# Patient Record
Sex: Male | Born: 1990 | Race: Black or African American | Hispanic: No | Marital: Single | Smoking: Current every day smoker
Health system: Southern US, Community
[De-identification: ages and names within clinical notes are randomized; demographics above are authoritative.]

## PROBLEM LIST (undated history)

## (undated) DIAGNOSIS — Z789 Other specified health status: Secondary | ICD-10-CM

## (undated) HISTORY — PX: APPENDECTOMY: SHX54

---

## 2017-06-04 ENCOUNTER — Emergency Department (HOSPITAL_COMMUNITY): Payer: No Typology Code available for payment source

## 2017-06-04 ENCOUNTER — Inpatient Hospital Stay (HOSPITAL_COMMUNITY)
Admission: EM | Admit: 2017-06-04 | Discharge: 2017-06-14 | DRG: 463 | Disposition: A | Discharge status: Home / Self Care | Payer: No Typology Code available for payment source | Attending: Orthopedic Surgery | Admitting: Orthopedic Surgery

## 2017-06-04 ENCOUNTER — Inpatient Hospital Stay (HOSPITAL_COMMUNITY): Payer: No Typology Code available for payment source

## 2017-06-04 DIAGNOSIS — S20219A Contusion of unspecified front wall of thorax, initial encounter: Secondary | ICD-10-CM | POA: Diagnosis present

## 2017-06-04 DIAGNOSIS — S2241XA Multiple fractures of ribs, right side, initial encounter for closed fracture: Secondary | ICD-10-CM | POA: Diagnosis present

## 2017-06-04 DIAGNOSIS — G8918 Other acute postprocedural pain: Secondary | ICD-10-CM

## 2017-06-04 DIAGNOSIS — R402143 Coma scale, eyes open, spontaneous, at hospital admission: Secondary | ICD-10-CM | POA: Diagnosis present

## 2017-06-04 DIAGNOSIS — Z419 Encounter for procedure for purposes other than remedying health state, unspecified: Secondary | ICD-10-CM

## 2017-06-04 DIAGNOSIS — T148XXA Other injury of unspecified body region, initial encounter: Secondary | ICD-10-CM

## 2017-06-04 DIAGNOSIS — S82202C Unspecified fracture of shaft of left tibia, initial encounter for open fracture type IIIA, IIIB, or IIIC: Secondary | ICD-10-CM | POA: Diagnosis present

## 2017-06-04 DIAGNOSIS — D62 Acute posthemorrhagic anemia: Secondary | ICD-10-CM

## 2017-06-04 DIAGNOSIS — S42301A Unspecified fracture of shaft of humerus, right arm, initial encounter for closed fracture: Principal | ICD-10-CM | POA: Diagnosis present

## 2017-06-04 DIAGNOSIS — S82252C Displaced comminuted fracture of shaft of left tibia, initial encounter for open fracture type IIIA, IIIB, or IIIC: Secondary | ICD-10-CM

## 2017-06-04 DIAGNOSIS — R52 Pain, unspecified: Secondary | ICD-10-CM | POA: Diagnosis present

## 2017-06-04 DIAGNOSIS — Z23 Encounter for immunization: Secondary | ICD-10-CM

## 2017-06-04 DIAGNOSIS — D72829 Elevated white blood cell count, unspecified: Secondary | ICD-10-CM

## 2017-06-04 DIAGNOSIS — S42101A Fracture of unspecified part of scapula, right shoulder, initial encounter for closed fracture: Secondary | ICD-10-CM | POA: Diagnosis present

## 2017-06-04 DIAGNOSIS — S30811A Abrasion of abdominal wall, initial encounter: Secondary | ICD-10-CM | POA: Diagnosis present

## 2017-06-04 DIAGNOSIS — R402253 Coma scale, best verbal response, oriented, at hospital admission: Secondary | ICD-10-CM | POA: Diagnosis present

## 2017-06-04 DIAGNOSIS — Z09 Encounter for follow-up examination after completed treatment for conditions other than malignant neoplasm: Secondary | ICD-10-CM

## 2017-06-04 DIAGNOSIS — S2249XA Multiple fractures of ribs, unspecified side, initial encounter for closed fracture: Secondary | ICD-10-CM

## 2017-06-04 DIAGNOSIS — R402363 Coma scale, best motor response, obeys commands, at hospital admission: Secondary | ICD-10-CM | POA: Diagnosis present

## 2017-06-04 DIAGNOSIS — S81812A Laceration without foreign body, left lower leg, initial encounter: Secondary | ICD-10-CM | POA: Diagnosis present

## 2017-06-04 DIAGNOSIS — Z880 Allergy status to penicillin: Secondary | ICD-10-CM | POA: Diagnosis not present

## 2017-06-04 DIAGNOSIS — T07XXXA Unspecified multiple injuries, initial encounter: Secondary | ICD-10-CM

## 2017-06-04 DIAGNOSIS — S42109A Fracture of unspecified part of scapula, unspecified shoulder, initial encounter for closed fracture: Secondary | ICD-10-CM

## 2017-06-04 DIAGNOSIS — Y92414 Local residential or business street as the place of occurrence of the external cause: Secondary | ICD-10-CM

## 2017-06-04 DIAGNOSIS — S27329A Contusion of lung, unspecified, initial encounter: Secondary | ICD-10-CM

## 2017-06-04 DIAGNOSIS — E871 Hypo-osmolality and hyponatremia: Secondary | ICD-10-CM

## 2017-06-04 HISTORY — DX: Other specified health status: Z78.9

## 2017-06-04 LAB — COMPREHENSIVE METABOLIC PANEL
ALBUMIN: 3.8 g/dL (ref 3.5–5.0)
ALT: 136 U/L — ABNORMAL HIGH (ref 17–63)
ANION GAP: 12 (ref 5–15)
AST: 186 U/L — ABNORMAL HIGH (ref 15–41)
Alkaline Phosphatase: 77 U/L (ref 38–126)
BILIRUBIN TOTAL: 0.6 mg/dL (ref 0.3–1.2)
BUN: 12 mg/dL (ref 6–20)
CO2: 21 mmol/L — ABNORMAL LOW (ref 22–32)
Calcium: 8.4 mg/dL — ABNORMAL LOW (ref 8.9–10.3)
Chloride: 104 mmol/L (ref 101–111)
Creatinine, Ser: 1.07 mg/dL (ref 0.61–1.24)
GFR calc Af Amer: >60 mL/min (ref >60)
Glucose, Bld: 136 mg/dL — ABNORMAL HIGH (ref 65–99)
POTASSIUM: 3.3 mmol/L — AB (ref 3.5–5.1)
Sodium: 137 mmol/L (ref 135–145)
TOTAL PROTEIN: 6.4 g/dL — AB (ref 6.5–8.1)

## 2017-06-04 LAB — I-STAT CHEM 8, ED
BUN: 15 mg/dL (ref 6–20)
CALCIUM ION: 1.04 mmol/L — AB (ref 1.15–1.40)
CREATININE: 1.1 mg/dL (ref 0.61–1.24)
Chloride: 105 mmol/L (ref 101–111)
Glucose, Bld: 135 mg/dL — ABNORMAL HIGH (ref 65–99)
HCT: 44 % (ref 39.0–52.0)
Hemoglobin: 15 g/dL (ref 13.0–17.0)
Potassium: 3.7 mmol/L (ref 3.5–5.1)
SODIUM: 142 mmol/L (ref 135–145)
TCO2: 23 mmol/L (ref 22–32)

## 2017-06-04 LAB — CBC
HEMATOCRIT: 42.8 % (ref 39.0–52.0)
HEMOGLOBIN: 14.9 g/dL (ref 13.0–17.0)
MCH: 31.9 pg (ref 26.0–34.0)
MCHC: 34.8 g/dL (ref 30.0–36.0)
MCV: 91.6 fL (ref 78.0–100.0)
Platelets: 211 10*3/uL (ref 150–400)
RBC: 4.67 MIL/uL (ref 4.22–5.81)
RDW: 13 % (ref 11.5–15.5)
WBC: 11.8 10*3/uL — AB (ref 4.0–10.5)

## 2017-06-04 LAB — PROTIME-INR
INR: 1.02
Prothrombin Time: 13.4 seconds (ref 11.4–15.2)

## 2017-06-04 LAB — SAMPLE TO BLOOD BANK

## 2017-06-04 LAB — I-STAT CG4 LACTIC ACID, ED: LACTIC ACID, VENOUS: 3.64 mmol/L — AB (ref 0.5–1.9)

## 2017-06-04 LAB — ETHANOL: Alcohol, Ethyl (B): <10 mg/dL (ref <10)

## 2017-06-04 LAB — CDS SEROLOGY

## 2017-06-04 MED ORDER — BACITRACIN-NEOMYCIN-POLYMYXIN OINTMENT TUBE
TOPICAL_OINTMENT | Freq: Two times a day (BID) | CUTANEOUS | Status: DC
  Administered 2017-06-05: 1 via TOPICAL
  Administered 2017-06-06 – 2017-06-07 (×2): via TOPICAL
  Administered 2017-06-07: 1 via TOPICAL
  Administered 2017-06-08 – 2017-06-09 (×4): via TOPICAL
  Administered 2017-06-10: 1 via TOPICAL
  Administered 2017-06-10 – 2017-06-11 (×2): via TOPICAL
  Administered 2017-06-12: 1 via TOPICAL
  Administered 2017-06-12 – 2017-06-14 (×5): via TOPICAL
  Filled 2017-06-04: qty 14.17
  Filled 2017-06-04 (×2): qty 1
  Filled 2017-06-04: qty 2

## 2017-06-04 MED ORDER — CEFAZOLIN SODIUM-DEXTROSE 2-4 GM/100ML-% IV SOLN
INTRAVENOUS | Status: AC
  Filled 2017-06-04: qty 100

## 2017-06-04 MED ORDER — TETANUS-DIPHTH-ACELL PERTUSSIS 5-2.5-18.5 LF-MCG/0.5 IM SUSP
INTRAMUSCULAR | Status: AC
  Filled 2017-06-04: qty 0.5

## 2017-06-04 MED ORDER — DEXTROSE-NACL 5-0.9 % IV SOLN
INTRAVENOUS | Status: DC
  Administered 2017-06-05 – 2017-06-09 (×5): via INTRAVENOUS

## 2017-06-04 MED ORDER — SODIUM CHLORIDE 0.9 % IV BOLUS (SEPSIS)
1000.0000 mL | Freq: Once | INTRAVENOUS | Status: AC
  Administered 2017-06-04: 1000 mL via INTRAVENOUS

## 2017-06-04 MED ORDER — IOPAMIDOL (ISOVUE-300) INJECTION 61%
INTRAVENOUS | Status: AC
  Administered 2017-06-04: 100 mL
  Filled 2017-06-04: qty 100

## 2017-06-04 MED ORDER — HYDROMORPHONE HCL 1 MG/ML IJ SOLN
1.0000 mg | INTRAMUSCULAR | Status: DC | PRN
  Administered 2017-06-04: 1 mg via INTRAVENOUS
  Filled 2017-06-04: qty 1

## 2017-06-04 MED ORDER — FENTANYL CITRATE (PF) 100 MCG/2ML IJ SOLN
100.0000 ug | Freq: Once | INTRAMUSCULAR | Status: AC
  Administered 2017-06-04: 100 ug via INTRAVENOUS

## 2017-06-04 MED ORDER — FENTANYL CITRATE (PF) 100 MCG/2ML IJ SOLN
INTRAMUSCULAR | Status: AC
  Filled 2017-06-04: qty 2

## 2017-06-04 MED ORDER — CEFAZOLIN SODIUM-DEXTROSE 2-4 GM/100ML-% IV SOLN
2.0000 g | Freq: Once | INTRAVENOUS | Status: AC
  Administered 2017-06-04: 2 g via INTRAVENOUS

## 2017-06-04 MED ORDER — TETANUS-DIPHTH-ACELL PERTUSSIS 5-2.5-18.5 LF-MCG/0.5 IM SUSP
0.5000 mL | Freq: Once | INTRAMUSCULAR | Status: AC
  Administered 2017-06-04: 0.5 mL via INTRAMUSCULAR

## 2017-06-04 MED ORDER — ONDANSETRON 4 MG PO TBDP
4.0000 mg | ORAL_TABLET | Freq: Four times a day (QID) | ORAL | Status: DC | PRN
  Filled 2017-06-04: qty 1

## 2017-06-04 MED ORDER — ONDANSETRON HCL 4 MG/2ML IJ SOLN
4.0000 mg | Freq: Four times a day (QID) | INTRAMUSCULAR | Status: DC | PRN
  Administered 2017-06-04 – 2017-06-06 (×2): 4 mg via INTRAVENOUS
  Filled 2017-06-04: qty 2

## 2017-06-04 NOTE — ED Notes (Signed)
Dr Jodi MourningZavitz given a copy of lactic acid results 3.64

## 2017-06-04 NOTE — ED Notes (Signed)
Port xray continues to be at bedside

## 2017-06-04 NOTE — ED Notes (Signed)
Samson FredericSwinteck, Brian MD at bedside
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
## 2017-06-04 NOTE — ED Notes (Signed)
Mother and fiance at bedside. Belongings given to mother including valuables ticket for items in safe.
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
## 2017-06-04 NOTE — Progress Notes (Signed)
Orthopedic Tech Progress Note Patient Details:  Luiz IronDarius Fugitt 03/30/1991 [id number]  Ortho Devices Type of Ortho Device: Ace wrap, Short leg splint, Stirrup splint Ortho Device/Splint Location: LLE Ortho Device/Splint Interventions: Ordered, Application   Post Interventions Patient Tolerated: Well Instructions Provided: Care of device   Jennye MoccasinHughes, Yue Glasheen Craig 06/04/2017, 9:12 PM

## 2017-06-04 NOTE — ED Notes (Signed)
Pt returned from CT, pt continues to state he does not want anyone called.

## 2017-06-05 ENCOUNTER — Inpatient Hospital Stay (HOSPITAL_COMMUNITY): Payer: No Typology Code available for payment source | Admitting: Anesthesiology

## 2017-06-05 ENCOUNTER — Encounter (HOSPITAL_COMMUNITY): Admission: EM | Disposition: A | Discharge status: Home / Self Care | Payer: Self-pay | Source: Home / Self Care

## 2017-06-05 ENCOUNTER — Inpatient Hospital Stay (HOSPITAL_COMMUNITY): Payer: No Typology Code available for payment source

## 2017-06-05 ENCOUNTER — Encounter (HOSPITAL_COMMUNITY): Payer: Self-pay | Admitting: Emergency Medicine

## 2017-06-05 HISTORY — PX: I & D EXTREMITY: SHX5045

## 2017-06-05 LAB — BASIC METABOLIC PANEL
ANION GAP: 7 (ref 5–15)
BUN: 13 mg/dL (ref 6–20)
CO2: 25 mmol/L (ref 22–32)
Calcium: 8.3 mg/dL — ABNORMAL LOW (ref 8.9–10.3)
Chloride: 107 mmol/L (ref 101–111)
Creatinine, Ser: 1.18 mg/dL (ref 0.61–1.24)
GFR calc Af Amer: >60 mL/min (ref >60)
Glucose, Bld: 142 mg/dL — ABNORMAL HIGH (ref 65–99)
POTASSIUM: 4.6 mmol/L (ref 3.5–5.1)
SODIUM: 139 mmol/L (ref 135–145)

## 2017-06-05 LAB — CBC
HCT: 37.3 % — ABNORMAL LOW (ref 39.0–52.0)
Hemoglobin: 12.5 g/dL — ABNORMAL LOW (ref 13.0–17.0)
MCH: 30.8 pg (ref 26.0–34.0)
MCHC: 33.5 g/dL (ref 30.0–36.0)
MCV: 91.9 fL (ref 78.0–100.0)
PLATELETS: 168 10*3/uL (ref 150–400)
RBC: 4.06 MIL/uL — AB (ref 4.22–5.81)
RDW: 13.3 % (ref 11.5–15.5)
WBC: 12.8 10*3/uL — AB (ref 4.0–10.5)

## 2017-06-05 LAB — HIV ANTIBODY (ROUTINE TESTING W REFLEX): HIV Screen 4th Generation wRfx: NONREACTIVE

## 2017-06-05 SURGERY — IRRIGATION AND DEBRIDEMENT EXTREMITY
Anesthesia: General | Laterality: Left

## 2017-06-05 MED ORDER — PROPOFOL 10 MG/ML IV BOLUS
INTRAVENOUS | Status: AC
  Filled 2017-06-05: qty 20

## 2017-06-05 MED ORDER — ARTIFICIAL TEARS OPHTHALMIC OINT
TOPICAL_OINTMENT | OPHTHALMIC | Status: AC
  Filled 2017-06-05: qty 3.5

## 2017-06-05 MED ORDER — METHOCARBAMOL 1000 MG/10ML IJ SOLN
500.0000 mg | Freq: Four times a day (QID) | INTRAVENOUS | Status: DC | PRN
  Filled 2017-06-05: qty 5

## 2017-06-05 MED ORDER — LACTATED RINGERS IV SOLN
INTRAVENOUS | Status: DC | PRN
  Administered 2017-06-05 (×2): via INTRAVENOUS

## 2017-06-05 MED ORDER — PHENYLEPHRINE HCL 10 MG/ML IJ SOLN
INTRAMUSCULAR | Status: DC | PRN
  Administered 2017-06-05: 50 ug/min via INTRAVENOUS

## 2017-06-05 MED ORDER — ROCURONIUM BROMIDE 10 MG/ML (PF) SYRINGE
PREFILLED_SYRINGE | INTRAVENOUS | Status: AC
  Filled 2017-06-05: qty 15

## 2017-06-05 MED ORDER — ONDANSETRON HCL 4 MG/2ML IJ SOLN
INTRAMUSCULAR | Status: DC | PRN
  Administered 2017-06-05: 4 mg via INTRAVENOUS

## 2017-06-05 MED ORDER — OXYCODONE HCL 5 MG PO TABS
5.0000 mg | ORAL_TABLET | ORAL | Status: DC | PRN
  Filled 2017-06-05: qty 1

## 2017-06-05 MED ORDER — FENTANYL CITRATE (PF) 250 MCG/5ML IJ SOLN
INTRAMUSCULAR | Status: AC
  Filled 2017-06-05: qty 5

## 2017-06-05 MED ORDER — LIDOCAINE HCL (PF) 2 % IJ SOLN
INTRAMUSCULAR | Status: DC | PRN
  Administered 2017-06-05: 60 mg via INTRADERMAL

## 2017-06-05 MED ORDER — METHOCARBAMOL 500 MG PO TABS
500.0000 mg | ORAL_TABLET | Freq: Four times a day (QID) | ORAL | Status: DC | PRN
  Administered 2017-06-05 – 2017-06-09 (×12): 500 mg via ORAL
  Filled 2017-06-05 (×11): qty 1

## 2017-06-05 MED ORDER — PHENYLEPHRINE 40 MCG/ML (10ML) SYRINGE FOR IV PUSH (FOR BLOOD PRESSURE SUPPORT)
PREFILLED_SYRINGE | INTRAVENOUS | Status: AC
  Filled 2017-06-05: qty 30

## 2017-06-05 MED ORDER — HYDROMORPHONE HCL 1 MG/ML IJ SOLN
0.2500 mg | INTRAMUSCULAR | Status: DC | PRN
  Administered 2017-06-05 (×2): 0.5 mg via INTRAVENOUS

## 2017-06-05 MED ORDER — DEXAMETHASONE SODIUM PHOSPHATE 10 MG/ML IJ SOLN
INTRAMUSCULAR | Status: AC
  Filled 2017-06-05: qty 3

## 2017-06-05 MED ORDER — HYDROMORPHONE HCL 1 MG/ML IJ SOLN
0.5000 mg | INTRAMUSCULAR | Status: DC | PRN
  Administered 2017-06-05 – 2017-06-06 (×8): 1 mg via INTRAVENOUS
  Administered 2017-06-06: 2 mg via INTRAVENOUS
  Administered 2017-06-06 – 2017-06-08 (×6): 1 mg via INTRAVENOUS
  Administered 2017-06-08: 2 mg via INTRAVENOUS
  Administered 2017-06-08 – 2017-06-09 (×6): 1 mg via INTRAVENOUS
  Filled 2017-06-05 (×2): qty 1
  Filled 2017-06-05: qty 2
  Filled 2017-06-05 (×14): qty 1
  Filled 2017-06-05: qty 2
  Filled 2017-06-05 (×3): qty 1

## 2017-06-05 MED ORDER — KETOROLAC TROMETHAMINE 15 MG/ML IJ SOLN
15.0000 mg | Freq: Four times a day (QID) | INTRAMUSCULAR | Status: AC
  Administered 2017-06-05 (×4): 15 mg via INTRAVENOUS
  Filled 2017-06-05 (×4): qty 1

## 2017-06-05 MED ORDER — HYDROMORPHONE HCL 1 MG/ML IJ SOLN
INTRAMUSCULAR | Status: AC
  Filled 2017-06-05: qty 2

## 2017-06-05 MED ORDER — METOCLOPRAMIDE HCL 5 MG/ML IJ SOLN: 5.0000 mg | Freq: Three times a day (TID) | INTRAMUSCULAR | Status: DC | PRN

## 2017-06-05 MED ORDER — FENTANYL CITRATE (PF) 250 MCG/5ML IJ SOLN
INTRAMUSCULAR | Status: DC | PRN
  Administered 2017-06-05: 150 ug via INTRAVENOUS
  Administered 2017-06-05: 50 ug via INTRAVENOUS

## 2017-06-05 MED ORDER — SUCCINYLCHOLINE CHLORIDE 20 MG/ML IJ SOLN
INTRAMUSCULAR | Status: DC | PRN
  Administered 2017-06-05: 160 mg via INTRAVENOUS

## 2017-06-05 MED ORDER — EPHEDRINE SULFATE 50 MG/ML IJ SOLN
INTRAMUSCULAR | Status: AC
  Filled 2017-06-05: qty 1

## 2017-06-05 MED ORDER — CLINDAMYCIN PHOSPHATE 600 MG/50ML IV SOLN
600.0000 mg | Freq: Four times a day (QID) | INTRAVENOUS | Status: DC
  Administered 2017-06-05 – 2017-06-06 (×5): 600 mg via INTRAVENOUS
  Filled 2017-06-05 (×7): qty 50

## 2017-06-05 MED ORDER — ENOXAPARIN SODIUM 40 MG/0.4ML ~~LOC~~ SOLN
40.0000 mg | SUBCUTANEOUS | Status: DC
  Administered 2017-06-07 – 2017-06-10 (×3): 40 mg via SUBCUTANEOUS
  Filled 2017-06-05 (×3): qty 0.4

## 2017-06-05 MED ORDER — PROPOFOL 10 MG/ML IV BOLUS
INTRAVENOUS | Status: DC | PRN
  Administered 2017-06-05: 200 mg via INTRAVENOUS

## 2017-06-05 MED ORDER — ONDANSETRON HCL 4 MG/2ML IJ SOLN
INTRAMUSCULAR | Status: AC
  Filled 2017-06-05: qty 6

## 2017-06-05 MED ORDER — DIPHENHYDRAMINE HCL 12.5 MG/5ML PO ELIX
12.5000 mg | ORAL_SOLUTION | ORAL | Status: DC | PRN
  Administered 2017-06-06: 25 mg via ORAL
  Filled 2017-06-05: qty 10

## 2017-06-05 MED ORDER — OXYCODONE HCL 5 MG PO TABS
10.0000 mg | ORAL_TABLET | ORAL | Status: DC | PRN
  Administered 2017-06-05 – 2017-06-11 (×29): 10 mg via ORAL
  Filled 2017-06-05 (×30): qty 2

## 2017-06-05 MED ORDER — ACETAMINOPHEN 650 MG RE SUPP: 650.0000 mg | RECTAL | Status: DC | PRN

## 2017-06-05 MED ORDER — DEXAMETHASONE SODIUM PHOSPHATE 10 MG/ML IJ SOLN
INTRAMUSCULAR | Status: DC | PRN
  Administered 2017-06-05: 5 mg via INTRAVENOUS

## 2017-06-05 MED ORDER — CEFAZOLIN SODIUM-DEXTROSE 2-3 GM-%(50ML) IV SOLR
INTRAVENOUS | Status: DC | PRN
  Administered 2017-06-05: 2 g via INTRAVENOUS

## 2017-06-05 MED ORDER — OXYCODONE HCL 5 MG/5ML PO SOLN
5.0000 mg | Freq: Once | ORAL | Status: DC | PRN
Start: 2017-06-05 — End: 2017-06-05

## 2017-06-05 MED ORDER — SUCCINYLCHOLINE CHLORIDE 200 MG/10ML IV SOSY
PREFILLED_SYRINGE | INTRAVENOUS | Status: AC
  Filled 2017-06-05: qty 20

## 2017-06-05 MED ORDER — LIDOCAINE 2% (20 MG/ML) 5 ML SYRINGE
INTRAMUSCULAR | Status: AC
  Filled 2017-06-05: qty 15

## 2017-06-05 MED ORDER — ONDANSETRON HCL 4 MG/2ML IJ SOLN: 4.0000 mg | Freq: Four times a day (QID) | INTRAMUSCULAR | Status: DC | PRN

## 2017-06-05 MED ORDER — CEFAZOLIN SODIUM-DEXTROSE 2-4 GM/100ML-% IV SOLN
2.0000 g | INTRAVENOUS | Status: AC
Start: 2017-06-05 — End: 2017-06-06
  Filled 2017-06-05: qty 100

## 2017-06-05 MED ORDER — MIDAZOLAM HCL 2 MG/2ML IJ SOLN
INTRAMUSCULAR | Status: AC
  Filled 2017-06-05: qty 2

## 2017-06-05 MED ORDER — METOCLOPRAMIDE HCL 5 MG PO TABS: 5.0000 mg | ORAL_TABLET | Freq: Three times a day (TID) | ORAL | Status: DC | PRN

## 2017-06-05 MED ORDER — ACETAMINOPHEN 325 MG PO TABS
650.0000 mg | ORAL_TABLET | ORAL | Status: DC | PRN
  Administered 2017-06-05 – 2017-06-09 (×4): 650 mg via ORAL
  Filled 2017-06-05 (×4): qty 2

## 2017-06-05 MED ORDER — PHENYLEPHRINE 40 MCG/ML (10ML) SYRINGE FOR IV PUSH (FOR BLOOD PRESSURE SUPPORT)
PREFILLED_SYRINGE | INTRAVENOUS | Status: DC | PRN
  Administered 2017-06-05 (×2): 80 ug via INTRAVENOUS

## 2017-06-05 MED ORDER — OXYCODONE HCL 5 MG PO TABS
5.0000 mg | ORAL_TABLET | Freq: Once | ORAL | Status: DC | PRN
Start: 2017-06-05 — End: 2017-06-05

## 2017-06-05 SURGICAL SUPPLY — 40 items
BANDAGE ELASTIC 4 VELCRO ST LF (GAUZE/BANDAGES/DRESSINGS) ×3 IMPLANT
BANDAGE ELASTIC 6 VELCRO ST LF (GAUZE/BANDAGES/DRESSINGS) ×3 IMPLANT
BAR GLASS FIBER EXFX 11X300 (EXFIX) ×3 IMPLANT
BAR GLASS FIBER EXFX 11X500 (EXFIX) ×6 IMPLANT
BNDG GAUZE ELAST 4 BULKY (GAUZE/BANDAGES/DRESSINGS) ×3 IMPLANT
CLAMP BLUE BAR TO BAR (EXFIX) ×6 IMPLANT
CLAMP BLUE BAR TO PIN (EXFIX) ×6 IMPLANT
COVER SURGICAL LIGHT HANDLE (MISCELLANEOUS) ×3 IMPLANT
DRESSING PREVENA PLUS CUSTOM (GAUZE/BANDAGES/DRESSINGS) ×1 IMPLANT
DRSG PREVENA PLUS CUSTOM (GAUZE/BANDAGES/DRESSINGS) ×3
DURAPREP 26ML APPLICATOR (WOUND CARE) ×3 IMPLANT
GAUZE SPONGE 4X4 12PLY STRL (GAUZE/BANDAGES/DRESSINGS) ×3 IMPLANT
GAUZE XEROFORM 1X8 LF (GAUZE/BANDAGES/DRESSINGS) ×3 IMPLANT
GAUZE XEROFORM 5X9 LF (GAUZE/BANDAGES/DRESSINGS) ×3 IMPLANT
GLOVE BIOGEL M 7.0 STRL (GLOVE) IMPLANT
GLOVE BIOGEL M STRL SZ7.5 (GLOVE) IMPLANT
GLOVE BIOGEL PI IND STRL 7.5 (GLOVE) ×1 IMPLANT
GLOVE BIOGEL PI IND STRL 8.5 (GLOVE) ×1 IMPLANT
GLOVE BIOGEL PI INDICATOR 7.5 (GLOVE) ×2
GLOVE BIOGEL PI INDICATOR 8.5 (GLOVE) ×2
GLOVE ECLIPSE 8.0 STRL XLNG CF (GLOVE) IMPLANT
GLOVE ORTHO TXT STRL SZ7.5 (GLOVE) ×6 IMPLANT
GLOVE SURG ORTHO 8.5 STRL (GLOVE) ×9 IMPLANT
GLOVE SURG SS PI 7.0 STRL IVOR (GLOVE) ×6 IMPLANT
GOWN STRL REUS W/TWL LRG LVL3 (GOWN DISPOSABLE) ×3 IMPLANT
GOWN STRL REUS W/TWL XL LVL3 (GOWN DISPOSABLE) ×6 IMPLANT
HALF PIN 5.0X160 (EXFIX) ×6 IMPLANT
KIT BASIN OR (CUSTOM PROCEDURE TRAY) ×3 IMPLANT
MANIFOLD NEPTUNE II (INSTRUMENTS) ×3 IMPLANT
PACK ORTHO EXTREMITY (CUSTOM PROCEDURE TRAY) ×3 IMPLANT
PAD ABD 8X10 STRL (GAUZE/BANDAGES/DRESSINGS) ×18 IMPLANT
PAD CAST 4YDX4 CTTN HI CHSV (CAST SUPPLIES) ×1 IMPLANT
PADDING CAST COTTON 4X4 STRL (CAST SUPPLIES) ×2
PIN CLAMP 2BAR 75MM BLUE (EXFIX) ×3 IMPLANT
PIN TRANSFIXING 5.0 (EXFIX) ×3 IMPLANT
SPLINT PLASTER CAST XFAST 5X30 (CAST SUPPLIES) ×1 IMPLANT
SPLINT PLASTER XFAST SET 5X30 (CAST SUPPLIES) ×2
SUT ETHILON 2 0 FS 18 (SUTURE) ×6 IMPLANT
TOWEL OR 17X26 10 PK STRL BLUE (TOWEL DISPOSABLE) ×6 IMPLANT
WND VAC CANISTER 500ML (MISCELLANEOUS) ×3 IMPLANT
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
## 2017-06-05 NOTE — Anesthesia Procedure Notes (Signed)
Procedure Name: Intubation Date/Time: 06/05/2017 1:08 AM Performed by: Edmonia CaprioAuston, Estera Ozier M, CRNA Pre-anesthesia Checklist: Patient identified, Emergency Drugs available, Suction available, Patient being monitored and Timeout performed Patient Re-evaluated:Patient Re-evaluated prior to induction Oxygen Delivery Method: Circle system utilized Preoxygenation: Pre-oxygenation with 100% oxygen Induction Type: IV induction and Rapid sequence Laryngoscope Size: Miller and 2 Grade View: Grade I Tube type: Oral Tube size: 7.5 mm Number of attempts: 1 Airway Equipment and Method: Stylet Placement Confirmation: ETT inserted through vocal cords under direct vision,  positive ETCO2 and breath sounds checked- equal and bilateral Secured at: 21 cm Tube secured with: Tape Dental Injury: Teeth and Oropharynx as per pre-operative assessment and Bloody posterior oropharynx  Comments: Old clotted blood noted in posterior oropharynx above the level of the cords.
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
## 2017-06-05 NOTE — Transfer of Care (Signed)
Immediate Anesthesia Transfer of Care Note  Patient: Jacob Potter  Procedure(s) Performed: IRRIGATION AND DEBRIDEMENT EXTREMITY; EXTERNAL FIXATOR FOR LEFT LOWER EXTREMITY (Left )  Patient Location: PACU  Anesthesia Type:General  Level of Consciousness: drowsy  Airway & Oxygen Therapy: Patient Spontanous Breathing and Patient connected to face mask oxygen  Post-op Assessment: Report given to RN and Post -op Vital signs reviewed and stable  Post vital signs: Reviewed and stable  Last Vitals:  Vitals:   06/05/17 0000 06/05/17 0010  BP:  (!) 111/58  Pulse: 90 94  Resp: 18   SpO2: 97% 91%    Last Pain:  Vitals:   06/05/17 0028  PainSc: Asleep         Complications: No apparent anesthesia complications

## 2017-06-05 NOTE — Progress Notes (Signed)
    Subjective: Day of Surgery Procedure(s) (LRB): IRRIGATION AND DEBRIDEMENT EXTREMITY; EXTERNAL FIXATOR FOR LEFT LOWER EXTREMITY (Left) Patient reports pain as 4 on 0-10 scale.   Denies CP or SOB.  Voiding without difficulty. Positive flatus. Objective: Vital signs in last 24 hours: Temp:  [97.3 F (36.3 C)-98.4 F (36.9 C)] 98.4 F (36.9 C) (12/23 0348) Pulse Rate:  [79-119] 99 (12/23 0348) Resp:  [14-26] 20 (12/23 0327) BP: (97-144)/(58-96) 133/80 (12/23 0348) SpO2:  [91 %-100 %] 95 % (12/23 0348) Weight:  [117.9 kg (260 lb)] 117.9 kg (260 lb) (12/22 2118)  Intake/Output from previous day: 12/22 0701 - 12/23 0700 In: 1900 [I.V.:1900] Out: 800 [Urine:750; Blood:50] Intake/Output this shift: No intake/output data recorded.  Labs: Recent Labs    06/04/17 2043 06/04/17 2050 06/05/17 0611  HGB 14.9 15.0 12.5*   Recent Labs    06/04/17 2043 06/04/17 2050 06/05/17 0611  WBC 11.8*  --  12.8*  RBC 4.67  --  4.06*  HCT 42.8 44.0 37.3*  PLT 211  --  168   Recent Labs    06/04/17 2043 06/04/17 2050 06/05/17 0611  NA 137 142 139  K 3.3* 3.7 4.6  CL 104 105 107  CO2 21*  --  25  BUN 12 15 13   CREATININE 1.07 1.10 1.18  GLUCOSE 136* 135* 142*  CALCIUM 8.4*  --  8.3*   Recent Labs    06/04/17 2043  INR 1.02    Physical Exam: Neurologically intact Intact pulses distally Compartment soft ex fix secure  Assessment/Plan: Day of Surgery Procedure(s) (LRB): IRRIGATION AND DEBRIDEMENT EXTREMITY; EXTERNAL FIXATOR FOR LEFT LOWER EXTREMITY (Left) Stable Plan per Dr Linna CapriceSwinteck   No new recommendations at this time  Venita LickBROOKS,Mikaella Escalona D for Dr. Venita Lickahari Keelee [facility] [phone] 06/05/2017, 10:13 AM

## 2017-06-05 NOTE — Anesthesia Preprocedure Evaluation (Addendum)
Anesthesia Evaluation  Patient identified by MRN, date of birth, ID band Patient awake    Reviewed: Allergy & Precautions, H&P , NPO status , Patient's Chart, lab work & pertinent test results  Airway Mallampati: II   Neck ROM: full    Dental  (+) Teeth Intact, Dental Advisory Given   Pulmonary neg pulmonary ROS, Current Smoker,    breath sounds clear to auscultation       Cardiovascular negative cardio ROS   Rhythm:regular Rate:Normal     Neuro/Psych    GI/Hepatic   Endo/Other  obese  Renal/GU      Musculoskeletal Left open tibia fx   Abdominal   Peds  Hematology   Anesthesia Other Findings   Reproductive/Obstetrics                            Anesthesia Physical Anesthesia Plan  ASA: II and emergent  Anesthesia Plan: General   Post-op Pain Management:    Induction: Intravenous  PONV Risk Score and Plan: 2 and Ondansetron, Dexamethasone, Midazolam and Treatment may vary due to age or medical condition  Airway Management Planned: Oral ETT  Additional Equipment:   Intra-op Plan:   Post-operative Plan: Extubation in OR  Informed Consent: I have reviewed the patients History and Physical, chart, labs and discussed the procedure including the risks, benefits and alternatives for the proposed anesthesia with the patient or authorized representative who has indicated his/her understanding and acceptance.     Plan Discussed with: CRNA, Anesthesiologist and Surgeon  Anesthesia Plan Comments:         Anesthesia Quick Evaluation

## 2017-06-05 NOTE — ED Notes (Signed)
Ortho placing splint at this time.  Noted to have increase in pain.  Medicated at this time per order with zofran 4mg  and dilaudid 1mg  IV.  O2 sats noted to be in low 90's.  Placed on 2L oxygen via [place] at this time.  Family at the bedside.
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
## 2017-06-06 ENCOUNTER — Inpatient Hospital Stay (HOSPITAL_COMMUNITY): Payer: No Typology Code available for payment source | Admitting: Anesthesiology

## 2017-06-06 ENCOUNTER — Inpatient Hospital Stay (HOSPITAL_COMMUNITY): Payer: No Typology Code available for payment source

## 2017-06-06 ENCOUNTER — Encounter (HOSPITAL_COMMUNITY): Payer: Self-pay | Admitting: Anesthesiology

## 2017-06-06 ENCOUNTER — Encounter (HOSPITAL_COMMUNITY): Admission: EM | Disposition: A | Discharge status: Home / Self Care | Payer: Self-pay | Source: Home / Self Care

## 2017-06-06 DIAGNOSIS — T07XXXA Unspecified multiple injuries, initial encounter: Secondary | ICD-10-CM

## 2017-06-06 DIAGNOSIS — S27329A Contusion of lung, unspecified, initial encounter: Secondary | ICD-10-CM

## 2017-06-06 DIAGNOSIS — S2249XA Multiple fractures of ribs, unspecified side, initial encounter for closed fracture: Secondary | ICD-10-CM

## 2017-06-06 DIAGNOSIS — S82252C Displaced comminuted fracture of shaft of left tibia, initial encounter for open fracture type IIIA, IIIB, or IIIC: Secondary | ICD-10-CM

## 2017-06-06 DIAGNOSIS — S42301A Unspecified fracture of shaft of humerus, right arm, initial encounter for closed fracture: Secondary | ICD-10-CM

## 2017-06-06 HISTORY — PX: TIBIA IM NAIL INSERTION: SHX2516

## 2017-06-06 LAB — CBC WITH DIFFERENTIAL/PLATELET
BASOS ABS: 0 10*3/uL (ref 0.0–0.1)
BASOS PCT: 0 %
EOS ABS: 0.2 10*3/uL (ref 0.0–0.7)
EOS PCT: 2 %
HCT: 27.6 % — ABNORMAL LOW (ref 39.0–52.0)
HEMOGLOBIN: 9.6 g/dL — AB (ref 13.0–17.0)
LYMPHS ABS: 0.8 10*3/uL (ref 0.7–4.0)
Lymphocytes Relative: 9 %
MCH: 31 pg (ref 26.0–34.0)
MCHC: 34.8 g/dL (ref 30.0–36.0)
MCV: 89 fL (ref 78.0–100.0)
Monocytes Absolute: 0.6 10*3/uL (ref 0.1–1.0)
Monocytes Relative: 6 %
NEUTROS PCT: 83 %
Neutro Abs: 7.7 10*3/uL (ref 1.7–7.7)
PLATELETS: 109 10*3/uL — AB (ref 150–400)
RBC: 3.1 MIL/uL — AB (ref 4.22–5.81)
RDW: 12.9 % (ref 11.5–15.5)
WBC: 9.3 10*3/uL (ref 4.0–10.5)

## 2017-06-06 LAB — URINALYSIS, ROUTINE W REFLEX MICROSCOPIC
Bacteria, UA: NONE SEEN
GLUCOSE, UA: NEGATIVE mg/dL
Ketones, ur: 5 mg/dL — AB
Leukocytes, UA: NEGATIVE
NITRITE: NEGATIVE
Protein, ur: 30 mg/dL — AB
SPECIFIC GRAVITY, URINE: 1.038 — AB (ref 1.005–1.030)
pH: 5 (ref 5.0–8.0)

## 2017-06-06 LAB — LACTIC ACID, PLASMA: LACTIC ACID, VENOUS: 0.9 mmol/L (ref 0.5–1.9)

## 2017-06-06 LAB — SURGICAL PCR SCREEN
MRSA, PCR: NEGATIVE
Staphylococcus aureus: NEGATIVE

## 2017-06-06 SURGERY — INSERTION, INTRAMEDULLARY ROD, TIBIA
Anesthesia: General | Site: Leg Lower | Laterality: Left

## 2017-06-06 MED ORDER — FENTANYL CITRATE (PF) 100 MCG/2ML IJ SOLN
25.0000 ug | INTRAMUSCULAR | Status: DC | PRN
Start: 2017-06-06 — End: 2017-06-06

## 2017-06-06 MED ORDER — VANCOMYCIN HCL 1000 MG IV SOLR
INTRAVENOUS | Status: AC
  Filled 2017-06-06: qty 1000

## 2017-06-06 MED ORDER — KETAMINE HCL 10 MG/ML IJ SOLN
INTRAMUSCULAR | Status: DC | PRN
  Administered 2017-06-06: 40 mg via INTRAVENOUS
  Administered 2017-06-06: 20 mg via INTRAVENOUS

## 2017-06-06 MED ORDER — DOUBLE ANTIBIOTIC 500-10000 UNIT/GM EX OINT
TOPICAL_OINTMENT | CUTANEOUS | Status: AC
  Filled 2017-06-06: qty 1

## 2017-06-06 MED ORDER — PROPOFOL 10 MG/ML IV BOLUS
INTRAVENOUS | Status: AC
  Filled 2017-06-06: qty 20

## 2017-06-06 MED ORDER — ALBUMIN HUMAN 5 % IV SOLN
INTRAVENOUS | Status: DC | PRN
  Administered 2017-06-06: 11:00:00 via INTRAVENOUS

## 2017-06-06 MED ORDER — LABETALOL HCL 5 MG/ML IV SOLN
5.0000 mg | INTRAVENOUS | Status: DC | PRN
  Administered 2017-06-06 (×2): 5 mg via INTRAVENOUS

## 2017-06-06 MED ORDER — LIDOCAINE 2% (20 MG/ML) 5 ML SYRINGE
INTRAMUSCULAR | Status: AC
  Filled 2017-06-06: qty 5

## 2017-06-06 MED ORDER — ONDANSETRON HCL 4 MG/2ML IJ SOLN
INTRAMUSCULAR | Status: AC
  Filled 2017-06-06: qty 2

## 2017-06-06 MED ORDER — PROPOFOL 10 MG/ML IV BOLUS
INTRAVENOUS | Status: DC | PRN
  Administered 2017-06-06: 150 mg via INTRAVENOUS

## 2017-06-06 MED ORDER — SUGAMMADEX SODIUM 200 MG/2ML IV SOLN
INTRAVENOUS | Status: DC | PRN
  Administered 2017-06-06: 200 mg via INTRAVENOUS

## 2017-06-06 MED ORDER — EPHEDRINE SULFATE 50 MG/ML IJ SOLN
INTRAMUSCULAR | Status: AC
  Filled 2017-06-06: qty 1

## 2017-06-06 MED ORDER — MIDAZOLAM HCL 5 MG/5ML IJ SOLN
INTRAMUSCULAR | Status: DC | PRN
  Administered 2017-06-06: 2 mg via INTRAVENOUS

## 2017-06-06 MED ORDER — LACTATED RINGERS IV SOLN
INTRAVENOUS | Status: DC
  Administered 2017-06-06 – 2017-06-08 (×3): via INTRAVENOUS

## 2017-06-06 MED ORDER — CEFAZOLIN SODIUM-DEXTROSE 2-4 GM/100ML-% IV SOLN
2.0000 g | Freq: Three times a day (TID) | INTRAVENOUS | Status: AC
  Administered 2017-06-06 – 2017-06-07 (×3): 2 g via INTRAVENOUS
  Filled 2017-06-06 (×3): qty 100

## 2017-06-06 MED ORDER — DEXAMETHASONE SODIUM PHOSPHATE 10 MG/ML IJ SOLN
INTRAMUSCULAR | Status: AC
  Filled 2017-06-06: qty 1

## 2017-06-06 MED ORDER — ESMOLOL HCL 100 MG/10ML IV SOLN
INTRAVENOUS | Status: DC | PRN
  Administered 2017-06-06: 30 mg via INTRAVENOUS
  Administered 2017-06-06 (×2): 20 mg via INTRAVENOUS
  Administered 2017-06-06: 30 mg via INTRAVENOUS

## 2017-06-06 MED ORDER — ROCURONIUM BROMIDE 10 MG/ML (PF) SYRINGE
PREFILLED_SYRINGE | INTRAVENOUS | Status: AC
  Filled 2017-06-06: qty 5

## 2017-06-06 MED ORDER — CEFAZOLIN SODIUM-DEXTROSE 1-4 GM/50ML-% IV SOLN
INTRAVENOUS | Status: DC | PRN
  Administered 2017-06-06: 1 g via INTRAVENOUS

## 2017-06-06 MED ORDER — VANCOMYCIN HCL 1000 MG IV SOLR
INTRAVENOUS | Status: DC | PRN
  Administered 2017-06-06: 1000 mg via TOPICAL

## 2017-06-06 MED ORDER — PROMETHAZINE HCL 25 MG/ML IJ SOLN: 6.2500 mg | INTRAMUSCULAR | Status: DC | PRN

## 2017-06-06 MED ORDER — SUGAMMADEX SODIUM 200 MG/2ML IV SOLN
INTRAVENOUS | Status: AC
  Filled 2017-06-06: qty 2

## 2017-06-06 MED ORDER — KETAMINE HCL-SODIUM CHLORIDE 100-0.9 MG/10ML-% IV SOSY
PREFILLED_SYRINGE | INTRAVENOUS | Status: AC
  Filled 2017-06-06: qty 10

## 2017-06-06 MED ORDER — ACETAMINOPHEN 10 MG/ML IV SOLN
INTRAVENOUS | Status: AC
  Filled 2017-06-06: qty 100

## 2017-06-06 MED ORDER — DEXAMETHASONE SODIUM PHOSPHATE 4 MG/ML IJ SOLN
INTRAMUSCULAR | Status: DC | PRN
  Administered 2017-06-06: 10 mg via INTRAVENOUS

## 2017-06-06 MED ORDER — FENTANYL CITRATE (PF) 100 MCG/2ML IJ SOLN
INTRAMUSCULAR | Status: DC | PRN
  Administered 2017-06-06: 100 ug via INTRAVENOUS
  Administered 2017-06-06: 50 ug via INTRAVENOUS

## 2017-06-06 MED ORDER — ESMOLOL HCL 100 MG/10ML IV SOLN
INTRAVENOUS | Status: AC
  Filled 2017-06-06: qty 10

## 2017-06-06 MED ORDER — ROCURONIUM BROMIDE 100 MG/10ML IV SOLN
INTRAVENOUS | Status: DC | PRN
  Administered 2017-06-06 (×2): 50 mg via INTRAVENOUS
  Administered 2017-06-06 (×2): 10 mg via INTRAVENOUS

## 2017-06-06 MED ORDER — LIDOCAINE 2% (20 MG/ML) 5 ML SYRINGE
INTRAMUSCULAR | Status: DC | PRN
  Administered 2017-06-06: 60 mg via INTRAVENOUS

## 2017-06-06 MED ORDER — SODIUM CHLORIDE 0.9 % IJ SOLN
INTRAMUSCULAR | Status: AC
  Filled 2017-06-06: qty 10

## 2017-06-06 MED ORDER — PHENYLEPHRINE 40 MCG/ML (10ML) SYRINGE FOR IV PUSH (FOR BLOOD PRESSURE SUPPORT)
PREFILLED_SYRINGE | INTRAVENOUS | Status: AC
  Filled 2017-06-06: qty 10

## 2017-06-06 MED ORDER — ACETAMINOPHEN 10 MG/ML IV SOLN
INTRAVENOUS | Status: DC | PRN
  Administered 2017-06-06: 1000 mg via INTRAVENOUS

## 2017-06-06 MED ORDER — FENTANYL CITRATE (PF) 250 MCG/5ML IJ SOLN
INTRAMUSCULAR | Status: AC
  Filled 2017-06-06: qty 5

## 2017-06-06 MED ORDER — ARTIFICIAL TEARS OPHTHALMIC OINT
TOPICAL_OINTMENT | OPHTHALMIC | Status: AC
  Filled 2017-06-06: qty 3.5

## 2017-06-06 MED ORDER — 0.9 % SODIUM CHLORIDE (POUR BTL) OPTIME
TOPICAL | Status: DC | PRN
  Administered 2017-06-06: 1000 mL

## 2017-06-06 MED ORDER — LABETALOL HCL 5 MG/ML IV SOLN
INTRAVENOUS | Status: AC
  Filled 2017-06-06: qty 4

## 2017-06-06 MED ORDER — MIDAZOLAM HCL 2 MG/2ML IJ SOLN
INTRAMUSCULAR | Status: AC
  Filled 2017-06-06: qty 2

## 2017-06-06 SURGICAL SUPPLY — 57 items
BANDAGE ACE 4X5 VEL STRL LF (GAUZE/BANDAGES/DRESSINGS) ×3 IMPLANT
BANDAGE ACE 6X5 VEL STRL LF (GAUZE/BANDAGES/DRESSINGS) ×3 IMPLANT
BANDAGE ELASTIC 4 VELCRO ST LF (GAUZE/BANDAGES/DRESSINGS) ×3 IMPLANT
BANDAGE ELASTIC 6 VELCRO ST LF (GAUZE/BANDAGES/DRESSINGS) ×3 IMPLANT
BIT DRILL 3.8X6 NS (BIT) ×6 IMPLANT
BIT DRILL 4.4 NS (BIT) ×3 IMPLANT
BLADE SURG 10 STRL SS (BLADE) ×6 IMPLANT
BNDG COHESIVE 4X5 TAN STRL (GAUZE/BANDAGES/DRESSINGS) ×3 IMPLANT
BNDG GAUZE ELAST 4 BULKY (GAUZE/BANDAGES/DRESSINGS) ×3 IMPLANT
BRUSH SCRUB SURG 4.25 DISP (MISCELLANEOUS) ×6 IMPLANT
CHLORAPREP W/TINT 26ML (MISCELLANEOUS) ×3 IMPLANT
COVER SURGICAL LIGHT HANDLE (MISCELLANEOUS) ×6 IMPLANT
DRAPE C-ARM 42X72 X-RAY (DRAPES) ×3 IMPLANT
DRAPE C-ARMOR (DRAPES) ×3 IMPLANT
DRAPE HALF SHEET 40X57 (DRAPES) ×6 IMPLANT
DRAPE IMP U-DRAPE 54X76 (DRAPES) ×6 IMPLANT
DRAPE INCISE IOBAN 66X45 STRL (DRAPES) IMPLANT
DRAPE ORTHO SPLIT 77X108 STRL (DRAPES) ×4
DRAPE SURG ORHT 6 SPLT 77X108 (DRAPES) ×2 IMPLANT
DRAPE U-SHAPE 47X51 STRL (DRAPES) ×3 IMPLANT
DRSG ADAPTIC 3X8 NADH LF (GAUZE/BANDAGES/DRESSINGS) ×3 IMPLANT
ELECT REM PT RETURN 9FT ADLT (ELECTROSURGICAL) ×3
ELECTRODE REM PT RTRN 9FT ADLT (ELECTROSURGICAL) ×1 IMPLANT
GAUZE SPONGE 4X4 12PLY STRL (GAUZE/BANDAGES/DRESSINGS) ×3 IMPLANT
GAUZE SPONGE 4X4 12PLY STRL LF (GAUZE/BANDAGES/DRESSINGS) ×3 IMPLANT
GLOVE BIO SURGEON STRL SZ7.5 (GLOVE) ×12 IMPLANT
GLOVE BIOGEL PI IND STRL 7.5 (GLOVE) ×1 IMPLANT
GLOVE BIOGEL PI INDICATOR 7.5 (GLOVE) ×2
GOWN STRL REUS W/ TWL LRG LVL3 (GOWN DISPOSABLE) ×2 IMPLANT
GOWN STRL REUS W/TWL LRG LVL3 (GOWN DISPOSABLE) ×4
GUIDEPIN 3.2X17.5 THRD DISP (PIN) ×3 IMPLANT
GUIDEWIRE BALL NOSE 80CM (WIRE) ×3 IMPLANT
HANDPIECE INTERPULSE COAX TIP (DISPOSABLE) ×2
KIT BASIN OR (CUSTOM PROCEDURE TRAY) ×3 IMPLANT
KIT DRSG PREVENA PLUS 7DAY 125 (MISCELLANEOUS) ×3 IMPLANT
KIT PREVENA INCISION MGT 13 (CANNISTER) ×3 IMPLANT
KIT ROOM TURNOVER OR (KITS) ×3 IMPLANT
NAIL TIBIAL 10X39M (Nail) ×3 IMPLANT
PACK TOTAL JOINT (CUSTOM PROCEDURE TRAY) ×3 IMPLANT
PAD ARMBOARD 7.5X6 YLW CONV (MISCELLANEOUS) ×6 IMPLANT
SCREW ACECAP 36MM (Screw) ×3 IMPLANT
SCREW ACECAP 38MM (Screw) ×3 IMPLANT
SCREW ACECAP 44MM (Screw) ×3 IMPLANT
SCREW ACECAP 50MM (Screw) ×3 IMPLANT
SCREW PROXIMAL DEPUY (Screw) ×4 IMPLANT
SCREW PRXML FT 45X5.5XLCK NS (Screw) ×1 IMPLANT
SCREW PRXML FT 65X5.5XNS CORT (Screw) ×1 IMPLANT
SET HNDPC FAN SPRY TIP SCT (DISPOSABLE) ×1 IMPLANT
STAPLER VISISTAT 35W (STAPLE) ×3 IMPLANT
SUT MNCRL AB 3-0 PS2 18 (SUTURE) ×3 IMPLANT
SUT VIC AB 0 CT1 27 (SUTURE)
SUT VIC AB 0 CT1 27XBRD ANBCTR (SUTURE) IMPLANT
SUT VIC AB 2-0 CT1 27 (SUTURE)
SUT VIC AB 2-0 CT1 TAPERPNT 27 (SUTURE) IMPLANT
TOWEL OR 17X24 6PK STRL BLUE (TOWEL DISPOSABLE) ×3 IMPLANT
TOWEL OR 17X26 10 PK STRL BLUE (TOWEL DISPOSABLE) ×6 IMPLANT
YANKAUER SUCT BULB TIP NO VENT (SUCTIONS) IMPLANT

## 2017-06-06 NOTE — Anesthesia Preprocedure Evaluation (Signed)
Anesthesia Evaluation  Patient identified by MRN, date of birth, ID band Patient awake    Reviewed: Allergy & Precautions, NPO status , Patient's Chart, lab work & pertinent test results  Airway Mallampati: II  TM Distance: >3 FB Neck ROM: Full    Dental  (+) Teeth Intact, Dental Advisory Given   Pulmonary Current Smoker,    Pulmonary exam normal breath sounds clear to auscultation       Cardiovascular Exercise Tolerance: Good negative cardio ROS   Rhythm:Regular Rate:Tachycardia     Neuro/Psych negative neurological ROS  negative psych ROS   GI/Hepatic negative GI ROS, Neg liver ROS,   Endo/Other  negative endocrine ROSObesity   Renal/GU negative Renal ROS     Musculoskeletal Right humeral shaft fracture left open tibial shaft fracture   Abdominal   Peds  Hematology  (+) Blood dyscrasia, anemia ,   Anesthesia Other Findings Day of surgery medications reviewed with the patient.  Reproductive/Obstetrics                             Anesthesia Physical Anesthesia Plan  ASA: II  Anesthesia Plan: General   Post-op Pain Management:    Induction: Intravenous  PONV Risk Score and Plan: 3 and Midazolam, Dexamethasone and Ondansetron  Airway Management Planned: Oral ETT  Additional Equipment:   Intra-op Plan:   Post-operative Plan: Extubation in OR  Informed Consent: I have reviewed the patients History and Physical, chart, labs and discussed the procedure including the risks, benefits and alternatives for the proposed anesthesia with the patient or authorized representative who has indicated his/her understanding and acceptance.   Dental advisory given  Plan Discussed with: CRNA  Anesthesia Plan Comments: (Risks/benefits of general anesthesia discussed with patient including risk of damage to teeth, lips, gum, and tongue, nausea/vomiting, allergic reactions to medications, and  the possibility of heart attack, stroke and death.  All patient questions answered.  Patient wishes to proceed.)        Anesthesia Quick Evaluation

## 2017-06-06 NOTE — Anesthesia Procedure Notes (Signed)
Procedure Name: Intubation Date/Time: 06/06/2017 10:26 AM Performed by: Caren Macadamarter, Yaelis Scharfenberg W, CRNA Pre-anesthesia Checklist: Patient identified, Emergency Drugs available, Suction available and Patient being monitored Patient Re-evaluated:Patient Re-evaluated prior to induction Oxygen Delivery Method: Circle system utilized Preoxygenation: Pre-oxygenation with 100% oxygen Induction Type: IV induction Ventilation: Mask ventilation without difficulty Laryngoscope Size: Miller and 2 Grade View: Grade I Tube type: Oral Tube size: 7.5 mm Number of attempts: 1 Airway Equipment and Method: Stylet and Oral airway Placement Confirmation: ETT inserted through vocal cords under direct vision,  positive ETCO2 and breath sounds checked- equal and bilateral Secured at: 23 cm Tube secured with: Tape Dental Injury: Teeth and Oropharynx as per pre-operative assessment

## 2017-06-06 NOTE — Anesthesia Postprocedure Evaluation (Signed)
Anesthesia Post Note  Patient: Jacob Potter  Procedure(s) Performed: INTRAMEDULLARY (IM) NAIL TIBIAL (Left Leg Lower)     Patient location during evaluation: PACU Anesthesia Type: General Level of consciousness: awake and alert Pain management: pain level controlled Vital Signs Assessment: post-procedure vital signs reviewed and stable Respiratory status: spontaneous breathing, nonlabored ventilation and respiratory function stable Cardiovascular status: blood pressure returned to baseline and stable Postop Assessment: no apparent nausea or vomiting Anesthetic complications: no    Last Vitals:  Vitals:   06/06/17 1331 06/06/17 1354  BP: (!) 147/93 (!) 141/90  Pulse:  (!) 105  Resp: 17 18  Temp: 36.7 C 37.7 C  SpO2: 93% 96%    Last Pain:  Vitals:   06/06/17 1354  TempSrc: Oral  PainSc:                  Cecile HearingStephen Edward Turk

## 2017-06-06 NOTE — Anesthesia Preprocedure Evaluation (Deleted)
Anesthesia Evaluation Anesthesia Physical Anesthesia Plan Anesthesia Quick Evaluation  

## 2017-06-06 NOTE — Anesthesia Postprocedure Evaluation (Signed)
Anesthesia Post Note  Patient: Jacob Potter  Procedure(Potter) Performed: IRRIGATION AND DEBRIDEMENT EXTREMITY; EXTERNAL FIXATOR FOR LEFT LOWER EXTREMITY (Left )     Patient location during evaluation: PACU Anesthesia Type: General Level of consciousness: awake and alert Pain management: pain level controlled Vital Signs Assessment: post-procedure vital signs reviewed and stable Respiratory status: spontaneous breathing, nonlabored ventilation, respiratory function stable and patient connected to nasal cannula oxygen Cardiovascular status: blood pressure returned to baseline and stable Postop Assessment: no apparent nausea or vomiting Anesthetic complications: no    Last Vitals:  Vitals:   06/06/17 1354 06/06/17 1800  BP: (!) 141/90 125/68  Pulse: (!) 105 (!) 111  Resp: 18 18  Temp: 37.7 C 37.7 C  SpO2: 96% 98%    Last Pain:  Vitals:   06/06/17 1800  TempSrc: Oral  PainSc:                  Jacob Potter
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
## 2017-06-06 NOTE — Plan of Care (Signed)
  Elimination: Will not experience complications related to bowel motility 06/06/2017 1411 - Progressing by Darrow BussingArcilla, Kyliana Standen M, RN   Pain Managment: General experience of comfort will improve 06/06/2017 1411 - Progressing by Darrow BussingArcilla, Tyronne Blann M, RN   Safety: Ability to remain free from injury will improve 06/06/2017 1411 - Progressing by Darrow BussingArcilla, Arriana Lohmann M, RN

## 2017-06-06 NOTE — Progress Notes (Signed)
PT Cancellation Note  Patient Details Name: Luiz IronDarius Schuessler MRN: [id number] DOB: 02/23/1991   Cancelled Treatment:    Reason Eval/Treat Not Completed: Other (comment)   Currently in OR; Noted also potential plans for OR 12/26;   Will continue to follow;   Van ClinesHolly Marelyn Rouser, PT  Acute Rehabilitation Services Pager [phone] Office [phone]    Levi AlandHolly H Maddoxx Burkitt 06/06/2017, 8:55 AM
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
## 2017-06-06 NOTE — Progress Notes (Signed)
Orthopedic Tech Progress Note Patient Details:  Luiz IronDarius Destin 01/16/1991 [id number]  Ortho Devices Type of Ortho Device: CAM walker Ortho Device/Splint Location: Trapeze bar Ortho Device/Splint Interventions: Application   Post Interventions Patient Tolerated: Well Instructions Provided: Care of device, Adjustment of device   Saul FordyceJennifer C Cuba Natarajan 06/06/2017, 6:25 PM

## 2017-06-06 NOTE — Transfer of Care (Signed)
Immediate Anesthesia Transfer of Care Note  Patient: Jacob Potter  Procedure(s) Performed: INTRAMEDULLARY (IM) NAIL TIBIAL (Left Leg Lower)  Patient Location: PACU  Anesthesia Type:General  Level of Consciousness: sedated  Airway & Oxygen Therapy: Patient Spontanous Breathing and Patient connected to face mask oxygen  Post-op Assessment: Report given to RN and Post -op Vital signs reviewed and stable  Post vital signs: Reviewed and stable  Last Vitals:  Vitals:   06/06/17 0659 06/06/17 0803  BP: (!) 141/77 138/76  Pulse: 81 (!) 109  Resp: 18 18  Temp: 37.7 C 37.6 C  SpO2: 95% 94%    Last Pain:  Vitals:   06/06/17 0818  TempSrc:   PainSc: 10-Worst pain ever         Complications: No apparent anesthesia complications

## 2017-06-07 ENCOUNTER — Inpatient Hospital Stay (HOSPITAL_COMMUNITY): Payer: No Typology Code available for payment source
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
## 2017-06-07 NOTE — Plan of Care (Signed)
  Nutrition: Adequate nutrition will be maintained 06/07/2017 1251 - Progressing by Darrow BussingArcilla, Sundi Slevin M, RN   Coping: Level of anxiety will decrease 06/07/2017 1251 - Progressing by Darrow BussingArcilla, Mistina Coatney M, RN   Elimination: Will not experience complications related to bowel motility 06/07/2017 1251 - Progressing by Darrow BussingArcilla, Jahayra Mazo M, RN   Pain Managment: General experience of comfort will improve 06/07/2017 1251 - Progressing by Darrow BussingArcilla, Braylyn Kalter M, RN   Safety: Ability to remain free from injury will improve 06/07/2017 1251 - Progressing by Darrow BussingArcilla, Kyle Luppino M, RN

## 2017-06-08 ENCOUNTER — Inpatient Hospital Stay (HOSPITAL_COMMUNITY): Payer: No Typology Code available for payment source

## 2017-06-08 ENCOUNTER — Inpatient Hospital Stay (HOSPITAL_COMMUNITY): Payer: No Typology Code available for payment source | Admitting: Certified Registered"

## 2017-06-08 ENCOUNTER — Encounter (HOSPITAL_COMMUNITY): Payer: Self-pay | Admitting: Surgery

## 2017-06-08 ENCOUNTER — Encounter (HOSPITAL_COMMUNITY): Admission: EM | Disposition: A | Discharge status: Home / Self Care | Payer: Self-pay | Source: Home / Self Care

## 2017-06-08 HISTORY — PX: ORIF HUMERUS FRACTURE: SHX2126

## 2017-06-08 SURGERY — OPEN REDUCTION INTERNAL FIXATION (ORIF) HUMERAL SHAFT FRACTURE
Anesthesia: General | Site: Arm Upper | Laterality: Right

## 2017-06-08 MED ORDER — METHOCARBAMOL 500 MG PO TABS
ORAL_TABLET | ORAL | Status: AC
  Administered 2017-06-08: 500 mg via ORAL
  Filled 2017-06-08: qty 1

## 2017-06-08 MED ORDER — EPHEDRINE SULFATE 50 MG/ML IJ SOLN
INTRAMUSCULAR | Status: AC
  Filled 2017-06-08: qty 1

## 2017-06-08 MED ORDER — OXYCODONE HCL 5 MG PO TABS
ORAL_TABLET | ORAL | Status: AC
  Administered 2017-06-08: 10 mg via ORAL
  Filled 2017-06-08: qty 2

## 2017-06-08 MED ORDER — SUGAMMADEX SODIUM 200 MG/2ML IV SOLN
INTRAVENOUS | Status: AC
  Filled 2017-06-08: qty 2

## 2017-06-08 MED ORDER — ONDANSETRON HCL 4 MG/2ML IJ SOLN
INTRAMUSCULAR | Status: DC | PRN
  Administered 2017-06-08: 4 mg via INTRAVENOUS

## 2017-06-08 MED ORDER — ARTIFICIAL TEARS OPHTHALMIC OINT
TOPICAL_OINTMENT | OPHTHALMIC | Status: DC | PRN
  Administered 2017-06-08: 1 via OPHTHALMIC

## 2017-06-08 MED ORDER — ROCURONIUM BROMIDE 100 MG/10ML IV SOLN
INTRAVENOUS | Status: DC | PRN
  Administered 2017-06-08: 70 mg via INTRAVENOUS
  Administered 2017-06-08 (×2): 10 mg via INTRAVENOUS
  Administered 2017-06-08 (×2): 20 mg via INTRAVENOUS

## 2017-06-08 MED ORDER — POVIDONE-IODINE 10 % EX SWAB: 2.0000 "application " | Freq: Once | CUTANEOUS | Status: DC

## 2017-06-08 MED ORDER — HYDROMORPHONE HCL 1 MG/ML IJ SOLN
INTRAMUSCULAR | Status: AC
  Administered 2017-06-08: 2 mg via INTRAVENOUS
  Filled 2017-06-08: qty 1

## 2017-06-08 MED ORDER — LACTATED RINGERS IV SOLN: INTRAVENOUS | Status: DC

## 2017-06-08 MED ORDER — HYDROMORPHONE HCL 1 MG/ML IJ SOLN
INTRAMUSCULAR | Status: AC
  Filled 2017-06-08: qty 1

## 2017-06-08 MED ORDER — MIDAZOLAM HCL 2 MG/2ML IJ SOLN
INTRAMUSCULAR | Status: AC
  Filled 2017-06-08: qty 2

## 2017-06-08 MED ORDER — DEXAMETHASONE SODIUM PHOSPHATE 4 MG/ML IJ SOLN
INTRAMUSCULAR | Status: DC | PRN
  Administered 2017-06-08: 8 mg via INTRAVENOUS

## 2017-06-08 MED ORDER — VANCOMYCIN HCL 1000 MG IV SOLR
INTRAVENOUS | Status: AC
  Filled 2017-06-08: qty 1000

## 2017-06-08 MED ORDER — CEFAZOLIN SODIUM-DEXTROSE 2-4 GM/100ML-% IV SOLN
2.0000 g | Freq: Three times a day (TID) | INTRAVENOUS | Status: AC
  Administered 2017-06-08 – 2017-06-09 (×3): 2 g via INTRAVENOUS
  Filled 2017-06-08 (×3): qty 100

## 2017-06-08 MED ORDER — PROPOFOL 10 MG/ML IV BOLUS
INTRAVENOUS | Status: AC
  Filled 2017-06-08: qty 20

## 2017-06-08 MED ORDER — PROPOFOL 10 MG/ML IV BOLUS
INTRAVENOUS | Status: DC | PRN
  Administered 2017-06-08: 200 mg via INTRAVENOUS

## 2017-06-08 MED ORDER — ARTIFICIAL TEARS OPHTHALMIC OINT
TOPICAL_OINTMENT | OPHTHALMIC | Status: AC
  Filled 2017-06-08: qty 3.5

## 2017-06-08 MED ORDER — LIDOCAINE 2% (20 MG/ML) 5 ML SYRINGE
INTRAMUSCULAR | Status: DC | PRN
  Administered 2017-06-08: 100 mg via INTRAVENOUS

## 2017-06-08 MED ORDER — CLINDAMYCIN PHOSPHATE 900 MG/50ML IV SOLN
900.0000 mg | INTRAVENOUS | Status: AC
  Administered 2017-06-08: 900 mg via INTRAVENOUS
  Filled 2017-06-08: qty 50

## 2017-06-08 MED ORDER — FENTANYL CITRATE (PF) 250 MCG/5ML IJ SOLN
INTRAMUSCULAR | Status: AC
  Filled 2017-06-08: qty 5

## 2017-06-08 MED ORDER — LACTATED RINGERS IV SOLN
INTRAVENOUS | Status: DC | PRN
  Administered 2017-06-08 (×2): via INTRAVENOUS

## 2017-06-08 MED ORDER — FENTANYL CITRATE (PF) 100 MCG/2ML IJ SOLN
INTRAMUSCULAR | Status: DC | PRN
  Administered 2017-06-08: 100 ug via INTRAVENOUS
  Administered 2017-06-08: 50 ug via INTRAVENOUS
  Administered 2017-06-08: 200 ug via INTRAVENOUS
  Administered 2017-06-08: 50 ug via INTRAVENOUS
  Administered 2017-06-08: 100 ug via INTRAVENOUS

## 2017-06-08 MED ORDER — ROCURONIUM BROMIDE 10 MG/ML (PF) SYRINGE
PREFILLED_SYRINGE | INTRAVENOUS | Status: AC
  Filled 2017-06-08: qty 5

## 2017-06-08 MED ORDER — CHLORHEXIDINE GLUCONATE 4 % EX LIQD: 60.0000 mL | Freq: Once | CUTANEOUS | Status: DC

## 2017-06-08 MED ORDER — FENTANYL CITRATE (PF) 100 MCG/2ML IJ SOLN
INTRAMUSCULAR | Status: AC
  Filled 2017-06-08: qty 2

## 2017-06-08 MED ORDER — ONDANSETRON HCL 4 MG/2ML IJ SOLN
INTRAMUSCULAR | Status: AC
  Filled 2017-06-08: qty 2

## 2017-06-08 MED ORDER — PHENYLEPHRINE 40 MCG/ML (10ML) SYRINGE FOR IV PUSH (FOR BLOOD PRESSURE SUPPORT)
PREFILLED_SYRINGE | INTRAVENOUS | Status: AC
  Filled 2017-06-08: qty 10

## 2017-06-08 MED ORDER — MIDAZOLAM HCL 5 MG/5ML IJ SOLN
INTRAMUSCULAR | Status: DC | PRN
  Administered 2017-06-08: 2 mg via INTRAVENOUS

## 2017-06-08 MED ORDER — DEXAMETHASONE SODIUM PHOSPHATE 10 MG/ML IJ SOLN
INTRAMUSCULAR | Status: AC
  Filled 2017-06-08: qty 1

## 2017-06-08 MED ORDER — LIDOCAINE 2% (20 MG/ML) 5 ML SYRINGE
INTRAMUSCULAR | Status: AC
  Filled 2017-06-08: qty 5

## 2017-06-08 MED ORDER — 0.9 % SODIUM CHLORIDE (POUR BTL) OPTIME
TOPICAL | Status: DC | PRN
  Administered 2017-06-08: 1000 mL

## 2017-06-08 MED ORDER — BUPIVACAINE-EPINEPHRINE (PF) 0.25% -1:200000 IJ SOLN
INTRAMUSCULAR | Status: AC
  Filled 2017-06-08: qty 30

## 2017-06-08 MED ORDER — GLYCOPYRROLATE 0.2 MG/ML IJ SOLN
INTRAMUSCULAR | Status: DC | PRN
  Administered 2017-06-08: 0.1 mg via INTRAVENOUS

## 2017-06-08 SURGICAL SUPPLY — 74 items
BANDAGE ACE 4X5 VEL STRL LF (GAUZE/BANDAGES/DRESSINGS) IMPLANT
BANDAGE ACE 6X5 VEL STRL LF (GAUZE/BANDAGES/DRESSINGS) ×3 IMPLANT
BANDAGE ELASTIC 4 VELCRO ST LF (GAUZE/BANDAGES/DRESSINGS) IMPLANT
BANDAGE ELASTIC 6 VELCRO ST LF (GAUZE/BANDAGES/DRESSINGS) IMPLANT
BIT DRILL 2.5X110 QC LCP DISP (BIT) ×3 IMPLANT
BIT DRILL 2.8 (BIT) ×1
BIT DRILL CANN QC 2.8X165 (BIT) ×1 IMPLANT
BIT DRILL QC 3.5X110 (BIT) ×3 IMPLANT
BNDG COHESIVE 4X5 TAN STRL (GAUZE/BANDAGES/DRESSINGS) ×3 IMPLANT
BRUSH SCRUB SURG 4.25 DISP (MISCELLANEOUS) ×6 IMPLANT
CHLORAPREP W/TINT 26ML (MISCELLANEOUS) ×3 IMPLANT
COVER SURGICAL LIGHT HANDLE (MISCELLANEOUS) ×6 IMPLANT
DERMABOND ADVANCED (GAUZE/BANDAGES/DRESSINGS) ×2
DERMABOND ADVANCED .7 DNX12 (GAUZE/BANDAGES/DRESSINGS) ×1 IMPLANT
DRAIN PENROSE 1/4X12 LTX STRL (WOUND CARE) ×3 IMPLANT
DRAPE C-ARM 42X72 X-RAY (DRAPES) ×3 IMPLANT
DRAPE INCISE IOBAN 66X45 STRL (DRAPES) ×3 IMPLANT
DRAPE ORTHO SPLIT 77X108 STRL (DRAPES) ×4
DRAPE SURG 17X23 STRL (DRAPES) ×3 IMPLANT
DRAPE SURG ORHT 6 SPLT 77X108 (DRAPES) ×2 IMPLANT
DRAPE U-SHAPE 47X51 STRL (DRAPES) ×3 IMPLANT
DRILL BIT 2.8MM (BIT) ×2
DRSG ADAPTIC 3X8 NADH LF (GAUZE/BANDAGES/DRESSINGS) ×3 IMPLANT
DRSG MEPILEX BORDER 4X12 (GAUZE/BANDAGES/DRESSINGS) ×3 IMPLANT
DRSG MEPILEX BORDER 4X8 (GAUZE/BANDAGES/DRESSINGS) ×3 IMPLANT
DRSG PAD ABDOMINAL 8X10 ST (GAUZE/BANDAGES/DRESSINGS) IMPLANT
ELECT REM PT RETURN 9FT ADLT (ELECTROSURGICAL) ×3
ELECTRODE REM PT RTRN 9FT ADLT (ELECTROSURGICAL) ×1 IMPLANT
EVACUATOR 1/8 PVC DRAIN (DRAIN) IMPLANT
GAUZE SPONGE 4X4 12PLY STRL (GAUZE/BANDAGES/DRESSINGS) ×3 IMPLANT
GLOVE BIO SURGEON STRL SZ7.5 (GLOVE) ×12 IMPLANT
GLOVE BIOGEL PI IND STRL 7.5 (GLOVE) ×2 IMPLANT
GLOVE BIOGEL PI INDICATOR 7.5 (GLOVE) ×4
GOWN STRL REUS W/ TWL LRG LVL3 (GOWN DISPOSABLE) ×2 IMPLANT
GOWN STRL REUS W/TWL LRG LVL3 (GOWN DISPOSABLE) ×4
KIT BASIN OR (CUSTOM PROCEDURE TRAY) ×3 IMPLANT
KIT ROOM TURNOVER OR (KITS) ×3 IMPLANT
MANIFOLD NEPTUNE II (INSTRUMENTS) ×3 IMPLANT
NEEDLE HYPO 25X1 1.5 SAFETY (NEEDLE) ×3 IMPLANT
NS IRRIG 1000ML POUR BTL (IV SOLUTION) ×3 IMPLANT
PACK ORTHO EXTREMITY (CUSTOM PROCEDURE TRAY) ×3 IMPLANT
PAD ARMBOARD 7.5X6 YLW CONV (MISCELLANEOUS) ×3 IMPLANT
PLATE LOCK DIST 230 10H RT (Plate) ×3 IMPLANT
SCREW CORTEX 3.5 24MM (Screw) ×8 IMPLANT
SCREW CORTEX 3.5 28MM (Screw) ×2 IMPLANT
SCREW CORTEX 3.5 30MM (Screw) ×6 IMPLANT
SCREW CORTEX 3.5 32MM (Screw) ×6 IMPLANT
SCREW LOCK CORT ST 3.5X24 (Screw) ×4 IMPLANT
SCREW LOCK CORT ST 3.5X28 (Screw) ×1 IMPLANT
SCREW LOCK CORT ST 3.5X30 (Screw) ×3 IMPLANT
SCREW LOCK CORT ST 3.5X32 (Screw) ×3 IMPLANT
SCREW LOCK T15 FT 16X3.5X2.9X (Screw) ×1 IMPLANT
SCREW LOCK T15 FT 20X3.5XST (Screw) ×1 IMPLANT
SCREW LOCKING 3.5X16 (Screw) ×2 IMPLANT
SCREW LOCKING 3.5X20 (Screw) ×2 IMPLANT
SCREW LOCKING 3.5X26 (Screw) ×3 IMPLANT
SPONGE LAP 18X18 X RAY DECT (DISPOSABLE) ×3 IMPLANT
STAPLER VISISTAT 35W (STAPLE) ×3 IMPLANT
SUCTION FRAZIER HANDLE 10FR (MISCELLANEOUS) ×2
SUCTION TUBE FRAZIER 10FR DISP (MISCELLANEOUS) ×1 IMPLANT
SUT ETHILON 3 0 PS 1 (SUTURE) ×12 IMPLANT
SUT MNCRL AB 3-0 PS2 18 (SUTURE) ×3 IMPLANT
SUT PROLENE 0 CT (SUTURE) IMPLANT
SUT VIC AB 0 CT1 27 (SUTURE) ×4
SUT VIC AB 0 CT1 27XBRD ANBCTR (SUTURE) ×2 IMPLANT
SUT VIC AB 2-0 CT1 27 (SUTURE) ×4
SUT VIC AB 2-0 CT1 TAPERPNT 27 (SUTURE) ×2 IMPLANT
SYR CONTROL 10ML LL (SYRINGE) ×3 IMPLANT
TOWEL OR 17X24 6PK STRL BLUE (TOWEL DISPOSABLE) ×9 IMPLANT
TRAY FOLEY W/METER SILVER 16FR (SET/KITS/TRAYS/PACK) ×3 IMPLANT
TUBE CONNECTING 12'X1/4 (SUCTIONS) ×1
TUBE CONNECTING 12X1/4 (SUCTIONS) ×2 IMPLANT
WATER STERILE IRR 1000ML POUR (IV SOLUTION) ×3 IMPLANT
YANKAUER SUCT BULB TIP NO VENT (SUCTIONS) IMPLANT

## 2017-06-08 NOTE — Interval H&P Note (Signed)
History and Physical Interval Note:  06/08/2017 10:45 AM  Jacob Potter  has presented today for surgery, with the diagnosis of Right humeral shaft fracture  The various methods of treatment have been discussed with the patient and family. After consideration of risks, benefits and other options for treatment, the patient has consented to  Procedure(s): OPEN REDUCTION INTERNAL FIXATION (ORIF) HUMERAL SHAFT FRACTURE (Right) as a surgical intervention .  The patient's history has been reviewed, patient examined, no change in status, stable for surgery.  I have reviewed the patient's chart and labs.  Questions were answered to the patient's satisfaction.     Haddix, Gillie MannersKevin P

## 2017-06-08 NOTE — Anesthesia Procedure Notes (Signed)
Procedure Name: Intubation Date/Time: 06/08/2017 11:14 AM Performed by: Julian ReilWelty, Wenceslaus Gist F, CRNA Pre-anesthesia Checklist: Patient identified, Emergency Drugs available, Suction available, Patient being monitored and Timeout performed Patient Re-evaluated:Patient Re-evaluated prior to induction Oxygen Delivery Method: Circle system utilized Preoxygenation: Pre-oxygenation with 100% oxygen Induction Type: IV induction Ventilation: Mask ventilation without difficulty and Oral airway inserted - appropriate to patient size Laryngoscope Size: Hyacinth MeekerMiller and 3 Grade View: Grade I Tube type: Oral Tube size: 7.5 mm Number of attempts: 1 Airway Equipment and Method: Stylet Placement Confirmation: ETT inserted through vocal cords under direct vision,  positive ETCO2 and breath sounds checked- equal and bilateral Secured at: 24 cm Tube secured with: Tape Dental Injury: Teeth and Oropharynx as per pre-operative assessment  Comments: 4x4s bite block used.
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
## 2017-06-08 NOTE — Plan of Care (Signed)
  Nutrition: Adequate nutrition will be maintained 06/08/2017 1822 - Progressing by Darrow BussingArcilla, Reshma Hoey M, RN   Coping: Level of anxiety will decrease 06/08/2017 1822 - Progressing by Darrow BussingArcilla, Verl Kitson M, RN   Elimination: Will not experience complications related to bowel motility 06/08/2017 1822 - Progressing by Darrow BussingArcilla, Iram Lundberg M, RN   Pain Managment: General experience of comfort will improve 06/08/2017 1822 - Progressing by Darrow BussingArcilla, Brittanee Ghazarian M, RN

## 2017-06-08 NOTE — Progress Notes (Signed)
PT Cancellation Note  Patient Details Name: Jacob Potter MRN: [id number] DOB: 04/30/1991   Cancelled Treatment:     Attempted to visit patient this AM, pt out of room for surgery. Will follow and re-attempt if time permits.  Etta GrandchildSean Jenisse Vullo, PT, DPT Acute Rehab Services Pager: [phone]     Etta GrandchildSean  Aviannah Castoro 06/08/2017, 9:43 AM

## 2017-06-08 NOTE — Transfer of Care (Signed)
Immediate Anesthesia Transfer of Care Note  Patient: Jacob Potter  Procedure(s) Performed: OPEN REDUCTION INTERNAL FIXATION (ORIF) HUMERAL SHAFT FRACTURE (Right Arm Upper)  Patient Location: PACU  Anesthesia Type:General  Level of Consciousness: awake and patient cooperative  Airway & Oxygen Therapy: Patient Spontanous Breathing and Patient connected to face mask oxygen  Post-op Assessment: Report given to RN and Post -op Vital signs reviewed and stable  Post vital signs: Reviewed and stable  Last Vitals:  Vitals:   06/08/17 0530 06/08/17 0918  BP: 128/74 (!) 142/78  Pulse: 96 97  Resp: 18 18  Temp: 37 C 37.4 C  SpO2: 97% 97%    Last Pain:  Vitals:   06/08/17 0918  TempSrc: Oral  PainSc:          Complications: No apparent anesthesia complications
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
## 2017-06-09 ENCOUNTER — Encounter (HOSPITAL_COMMUNITY): Payer: Self-pay | Admitting: Student

## 2017-06-09 LAB — CBC
HEMATOCRIT: 24.7 % — AB (ref 39.0–52.0)
HEMOGLOBIN: 8.3 g/dL — AB (ref 13.0–17.0)
MCH: 30.3 pg (ref 26.0–34.0)
MCHC: 33.6 g/dL (ref 30.0–36.0)
MCV: 90.1 fL (ref 78.0–100.0)
Platelets: 209 10*3/uL (ref 150–400)
RBC: 2.74 MIL/uL — ABNORMAL LOW (ref 4.22–5.81)
RDW: 12.9 % (ref 11.5–15.5)
WBC: 12.6 10*3/uL — AB (ref 4.0–10.5)

## 2017-06-09 MED ORDER — ACETAMINOPHEN 500 MG PO TABS
1000.0000 mg | ORAL_TABLET | Freq: Three times a day (TID) | ORAL | Status: DC
  Administered 2017-06-09 – 2017-06-14 (×16): 1000 mg via ORAL
  Filled 2017-06-09 (×16): qty 2

## 2017-06-09 MED ORDER — DOCUSATE SODIUM 100 MG PO CAPS
100.0000 mg | ORAL_CAPSULE | Freq: Two times a day (BID) | ORAL | Status: DC
  Administered 2017-06-09 – 2017-06-13 (×10): 100 mg via ORAL
  Filled 2017-06-09 (×11): qty 1

## 2017-06-09 MED ORDER — POLYETHYLENE GLYCOL 3350 17 G PO PACK
17.0000 g | PACK | Freq: Every day | ORAL | Status: DC | PRN
  Administered 2017-06-09: 17 g via ORAL
  Filled 2017-06-09: qty 1

## 2017-06-09 MED ORDER — METHOCARBAMOL 500 MG PO TABS
500.0000 mg | ORAL_TABLET | Freq: Three times a day (TID) | ORAL | Status: DC
  Administered 2017-06-09 – 2017-06-13 (×12): 500 mg via ORAL
  Filled 2017-06-09 (×12): qty 1

## 2017-06-09 MED ORDER — HYDROMORPHONE HCL 1 MG/ML IJ SOLN
0.5000 mg | INTRAMUSCULAR | Status: DC | PRN
  Administered 2017-06-10 – 2017-06-13 (×13): 1 mg via INTRAVENOUS
  Filled 2017-06-09 (×13): qty 1
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
## 2017-06-09 NOTE — Plan of Care (Signed)
  Nutrition: Adequate nutrition will be maintained 06/09/2017 1114 - Progressing by Darrow BussingArcilla, Dalante Minus M, RN   Coping: Level of anxiety will decrease 06/09/2017 1114 - Progressing by Darrow BussingArcilla, Cattie Tineo M, RN   Elimination: Will not experience complications related to bowel motility 06/09/2017 1114 - Progressing by Darrow BussingArcilla, Khalia Gong M, RN   Pain Managment: General experience of comfort will improve 06/09/2017 1114 - Progressing by Darrow BussingArcilla, Rosamond Andress M, RN   Safety: Ability to remain free from injury will improve 06/09/2017 1114 - Progressing by Darrow BussingArcilla, Cresta Riden M, RN

## 2017-06-09 NOTE — Progress Notes (Signed)
[facility] Progress Note  1 Day Post-Op  Subjective: CC: pain Patient reports pain is all over when present, but medication is helping. Discussed changes made to pain regimen to try to get patient off IV medication. Tolerating diet, denies n/v, passing flatus. No numbness or tingling. UOP good. VSS.  Patient lives at home with wife and children. Patient also has his mother in town.   Objective: Vital signs in last 24 hours: Temp:  [98 F (36.7 C)-99.2 F (37.3 C)] 98 F (36.7 C) (12/27 0612) Pulse Rate:  [81-129] 81 (12/27 0612) Resp:  [14-22] 18 (12/27 0612) BP: (135-165)/(74-124) 135/74 (12/27 0612) SpO2:  [93 %-100 %] 100 % (12/27 0612) Last BM Date: 06/04/17  Intake/Output from previous day: 12/26 0701 - 12/27 0700 In: 1400 [I.V.:1400] Out: 1280 [Urine:980; Blood:300] Intake/Output this shift: Total I/O In: 240 [P.O.:240] Out: -   PE: Gen:  Alert, NAD, pleasant Card:  Regular rate and rhythm, pedal pulse 2+ on the Right, radial pulse 2+ BL Pulm:  Normal effort, clear to auscultation bilaterally, pulling 1250 on IS Abd: Soft, non-tender, non-distended, bowel sounds present, abrasions without purulence  Ext: RUE in splint, fingers WWP, sensation/motor intact. LLE in boot, toes WWP, sensation/motor intact, sutures present in LLE with no surrounding erythema or drainage Skin: no rash Psych: A&Ox3   Lab Results:  No results for input(s): WBC, HGB, HCT, PLT in the last 72 hours. BMET No results for input(s): NA, K, CL, CO2, GLUCOSE, BUN, CREATININE, CALCIUM in the last 72 hours. PT/INR No results for input(s): LABPROT, INR in the last 72 hours. CMP     Component Value Date/Time   NA 139 06/05/2017 0611   K 4.6 06/05/2017 0611   CL 107 06/05/2017 0611   CO2 25 06/05/2017 0611   GLUCOSE 142 (H) 06/05/2017 0611   BUN 13 06/05/2017 0611   CREATININE 1.18 06/05/2017 0611   CALCIUM 8.3 (L) 06/05/2017 0611   PROT 6.4 (L) 06/04/2017 2043   ALBUMIN 3.8  06/04/2017 2043   AST 186 (H) 06/04/2017 2043   ALT 136 (H) 06/04/2017 2043   ALKPHOS 77 06/04/2017 2043   BILITOT 0.6 06/04/2017 2043   GFRNONAA >60 06/05/2017 0611   GFRAA >60 06/05/2017 0611   Lipase  No results found for: LIPASE     Studies/Results: Dg Chest 2 View  Result Date: 06/07/2017 CLINICAL DATA:  Scapular fracture.  Right shoulder pain. EXAM: CHEST  2 VIEW COMPARISON:  Chest x-ray 06/04/2017 FINDINGS: Right scapular and rib fractures are again noted. Low lung volumes. Patchy airspace opacity in the right upper lobe may reflect contusion. This is not significantly changed. Left lung remains clear. No effusion or pneumothorax. IMPRESSION: Right upper lobe airspace disease most compatible with contusion, stable. Low lung volumes. Electronically Signed   By: Charlett NoseKevin  Dover M.D.   On: 06/07/2017 12:48   Dg Humerus Right  Result Date: 06/08/2017 CLINICAL DATA:  Status post open reduction and internal fixation of right humeral fracture. EXAM: RIGHT HUMERUS - 2+ VIEW COMPARISON:  Fluoroscopic images of same day. Radiographs of June 04, 2017. FINDINGS: Status post surgical internal fixation of comminuted fracture involving distal right humeral shaft. Significantly improved alignment of fracture components is noted. IMPRESSION: Status post surgical internal fixation of comminuted distal right humeral shaft fracture. Electronically Signed   By: Lupita RaiderJames  Green Jr, M.D.   On: 06/08/2017 16:03   Dg Humerus Right  Result Date: 06/08/2017 CLINICAL DATA:  ORIF right humeral fracture EXAM: RIGHT HUMERUS -  2+ VIEW; DG C-ARM GT 120 MIN COMPARISON:  Preoperative radiographs. FINDINGS: For C-arm images show placement of a plate and screws for reduction of a comminuted angulated humeral fracture. Postoperative alignment is anatomic. IMPRESSION: Restoration of anatomic alignment. Electronically Signed   By: Paulina FusiMark  Shogry M.D.   On: 06/08/2017 14:18   Dg C-arm Gt 120 Min  Result Date:  06/08/2017 CLINICAL DATA:  ORIF right humeral fracture EXAM: RIGHT HUMERUS - 2+ VIEW; DG C-ARM GT 120 MIN COMPARISON:  Preoperative radiographs. FINDINGS: For C-arm images show placement of a plate and screws for reduction of a comminuted angulated humeral fracture. Postoperative alignment is anatomic. IMPRESSION: Restoration of anatomic alignment. Electronically Signed   By: Paulina FusiMark  Shogry M.D.   On: 06/08/2017 14:18    Anti-infectives: Anti-infectives (From admission, onward)   Start     Dose/Rate Route Frequency Ordered Stop   06/08/17 1830  ceFAZolin (ANCEF) IVPB 2g/100 mL premix     2 g 200 mL/hr over 30 Minutes Intravenous Every 8 hours 06/08/17 1617 06/09/17 2159   06/08/17 1030  clindamycin (CLEOCIN) IVPB 900 mg     900 mg 100 mL/hr over 30 Minutes Intravenous On call to O.R. 06/08/17 0805 06/08/17 1159   06/06/17 1800  ceFAZolin (ANCEF) IVPB 2g/100 mL premix     2 g 200 mL/hr over 30 Minutes Intravenous Every 8 hours 06/06/17 1358 06/07/17 1440   06/06/17 1157  vancomycin (VANCOCIN) powder  Status:  Discontinued       As needed 06/06/17 1157 06/06/17 1235   06/05/17 1000  ceFAZolin (ANCEF) IVPB 2g/100 mL premix     2 g 200 mL/hr over 30 Minutes Intravenous On call to O.R. 06/05/17 0958 06/06/17 0559   06/05/17 0500  clindamycin (CLEOCIN) IVPB 600 mg  Status:  Discontinued     600 mg 100 mL/hr over 30 Minutes Intravenous Every 6 hours 06/05/17 0352 06/06/17 1358   06/04/17 2115  ceFAZolin (ANCEF) IVPB 2g/100 mL premix     2 g 200 mL/hr over 30 Minutes Intravenous  Once 06/04/17 2107 06/04/17 2144       Assessment/Plan [facility] Right humerus fracture - s/p ORIF 12/26 Dr. Jena GaussHaddix - NWB - ok to start ROM shoulder/elbow Left tib/fib fracture - s/p IMN 12/24 Dr. Jena GaussHaddix - TDWB Right 2-7 rib fractures - IS, pulm toilet, pain control Right scapula fracture - non-op management Abdominal wall abrasions - abdominal exam benign, continue local wound care  FEN: regular diet; scheduled  tylenol and robaxin, encourage PO pain control over IV VTE: lovenox ID: Ancef 12/22>>, clindamycin 12/23 and 12/26 Foley: none Follow up: Haddix 2-3 wks  Dispo: Therapies [facility], continue PT/OT. Pain control. AM labs.  LOS: 5 days    Wells GuilesKelly Rayburn , [facility] 06/09/2017, 10:46 AM Pager: [phone] Trauma Pager: [phone] Mon-Fri 7:00 am-4:30 pm Sat-Sun 7:00 am-11:30 am
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
## 2017-06-09 NOTE — Anesthesia Postprocedure Evaluation (Signed)
Anesthesia Post Note  Patient: Jacob Potter  Procedure(s) Performed: OPEN REDUCTION INTERNAL FIXATION (ORIF) HUMERAL SHAFT FRACTURE (Right Arm Upper)     Patient location during evaluation: PACU Anesthesia Type: General Level of consciousness: awake and alert Pain management: pain level controlled Vital Signs Assessment: post-procedure vital signs reviewed and stable Respiratory status: spontaneous breathing, nonlabored ventilation, respiratory function stable and patient connected to nasal cannula oxygen Cardiovascular status: blood pressure returned to baseline and stable Postop Assessment: no apparent nausea or vomiting Anesthetic complications: no    Last Vitals:  Vitals:   06/09/17 0307 06/09/17 0612  BP: (!) 144/89 135/74  Pulse: 93 81  Resp: 17 18  Temp: 37.3 C 36.7 C  SpO2: 100% 100%    Last Pain:  Vitals:   06/09/17 0645  TempSrc:   PainSc: 8                  Jaimie Redditt,JAMES TERRILL

## 2017-06-10 DIAGNOSIS — S27329D Contusion of lung, unspecified, subsequent encounter: Secondary | ICD-10-CM

## 2017-06-10 DIAGNOSIS — S42109A Fracture of unspecified part of scapula, unspecified shoulder, initial encounter for closed fracture: Secondary | ICD-10-CM

## 2017-06-10 DIAGNOSIS — T07XXXA Unspecified multiple injuries, initial encounter: Secondary | ICD-10-CM

## 2017-06-10 DIAGNOSIS — E871 Hypo-osmolality and hyponatremia: Secondary | ICD-10-CM

## 2017-06-10 DIAGNOSIS — S2249XD Multiple fractures of ribs, unspecified side, subsequent encounter for fracture with routine healing: Secondary | ICD-10-CM

## 2017-06-10 DIAGNOSIS — D62 Acute posthemorrhagic anemia: Secondary | ICD-10-CM

## 2017-06-10 DIAGNOSIS — S42109D Fracture of unspecified part of scapula, unspecified shoulder, subsequent encounter for fracture with routine healing: Secondary | ICD-10-CM

## 2017-06-10 DIAGNOSIS — T148XXA Other injury of unspecified body region, initial encounter: Secondary | ICD-10-CM

## 2017-06-10 DIAGNOSIS — G8918 Other acute postprocedural pain: Secondary | ICD-10-CM

## 2017-06-10 DIAGNOSIS — S42301D Unspecified fracture of shaft of humerus, right arm, subsequent encounter for fracture with routine healing: Secondary | ICD-10-CM

## 2017-06-10 DIAGNOSIS — D72829 Elevated white blood cell count, unspecified: Secondary | ICD-10-CM

## 2017-06-10 LAB — CBC
HEMATOCRIT: 26.3 % — AB (ref 39.0–52.0)
HEMOGLOBIN: 8.7 g/dL — AB (ref 13.0–17.0)
MCH: 30.4 pg (ref 26.0–34.0)
MCHC: 33.1 g/dL (ref 30.0–36.0)
MCV: 92 fL (ref 78.0–100.0)
Platelets: 253 10*3/uL (ref 150–400)
RBC: 2.86 MIL/uL — AB (ref 4.22–5.81)
RDW: 13.4 % (ref 11.5–15.5)
WBC: 12.6 10*3/uL — ABNORMAL HIGH (ref 4.0–10.5)

## 2017-06-10 LAB — BASIC METABOLIC PANEL
Anion gap: 12 (ref 5–15)
BUN: 8 mg/dL (ref 6–20)
CHLORIDE: 97 mmol/L — AB (ref 101–111)
CO2: 25 mmol/L (ref 22–32)
Calcium: 8.4 mg/dL — ABNORMAL LOW (ref 8.9–10.3)
Creatinine, Ser: 0.79 mg/dL (ref 0.61–1.24)
GFR calc Af Amer: >60 mL/min (ref >60)
GFR calc non Af Amer: >60 mL/min (ref >60)
GLUCOSE: 140 mg/dL — AB (ref 65–99)
POTASSIUM: 3.8 mmol/L (ref 3.5–5.1)
SODIUM: 134 mmol/L — AB (ref 135–145)

## 2017-06-10 MED ORDER — ENOXAPARIN SODIUM 40 MG/0.4ML ~~LOC~~ SOLN
40.0000 mg | SUBCUTANEOUS | Status: DC
Start: 2017-06-10 — End: 2017-06-14
  Administered 2017-06-11 – 2017-06-13 (×3): 40 mg via SUBCUTANEOUS
  Filled 2017-06-10 (×3): qty 0.4
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
## 2017-06-10 NOTE — Progress Notes (Signed)
I met with pt at bedside after discussing recommendations with Dr. Delice Lesch. Felt that pain limiting factor at this time with more intense therapies. Pt to discuss his options with his girlfriend and Mom today. I will follow up Monday to reassess for a potential admission. [phone]

## 2017-06-10 NOTE — Progress Notes (Signed)
Trauma Service Note  Subjective: Patient is up in chair.  Got there with PT.  Wants to consider Rehab.  Objective: Vital signs in last 24 hours: Temp:  [99 F (37.2 C)-99.5 F (37.5 C)] 99 F (37.2 C) (12/28 0511) Pulse Rate:  [91-99] 99 (12/28 0511) Resp:  [18] 18 (12/27 1500) BP: (108-137)/(72-85) 108/72 (12/28 0511) SpO2:  [97 %-98 %] 97 % (12/28 0511) Last BM Date: 06/10/17  Intake/Output from previous day: 12/27 0701 - 12/28 0700 In: 480 [P.O.:480] Out: 250 [Urine:250] Intake/Output this shift: Total I/O In: 240 [P.O.:240] Out: -   General: No distress  Lungs: Clear  Abd: Good bowel sounds.  Had bowel movement today.  Extremities: No changes.    Neuro: Intact  Lab Results: CBC  Recent Labs    06/09/17 1001 06/10/17 0700  WBC 12.6* 12.6*  HGB 8.3* 8.7*  HCT 24.7* 26.3*  PLT 209 253   BMET Recent Labs    06/10/17 0700  NA 134*  K 3.8  CL 97*  CO2 25  GLUCOSE 140*  BUN 8  CREATININE 0.79  CALCIUM 8.4*   PT/INR No results for input(s): LABPROT, INR in the last 72 hours. ABG No results for input(s): PHART, HCO3 in the last 72 hours.  Invalid input(s): PCO2, PO2  Studies/Results: Dg Humerus Right  Result Date: 06/08/2017 CLINICAL DATA:  Status post open reduction and internal fixation of right humeral fracture. EXAM: RIGHT HUMERUS - 2+ VIEW COMPARISON:  Fluoroscopic images of same day. Radiographs of June 04, 2017. FINDINGS: Status post surgical internal fixation of comminuted fracture involving distal right humeral shaft. Significantly improved alignment of fracture components is noted. IMPRESSION: Status post surgical internal fixation of comminuted distal right humeral shaft fracture. Electronically Signed   By: Lupita RaiderJames  Green Jr, M.D.   On: 06/08/2017 16:03   Dg Humerus Right  Result Date: 06/08/2017 CLINICAL DATA:  ORIF right humeral fracture EXAM: RIGHT HUMERUS - 2+ VIEW; DG C-ARM GT 120 MIN COMPARISON:  Preoperative radiographs.  FINDINGS: For C-arm images show placement of a plate and screws for reduction of a comminuted angulated humeral fracture. Postoperative alignment is anatomic. IMPRESSION: Restoration of anatomic alignment. Electronically Signed   By: Paulina FusiMark  Shogry M.D.   On: 06/08/2017 14:18   Dg C-arm Gt 120 Min  Result Date: 06/08/2017 CLINICAL DATA:  ORIF right humeral fracture EXAM: RIGHT HUMERUS - 2+ VIEW; DG C-ARM GT 120 MIN COMPARISON:  Preoperative radiographs. FINDINGS: For C-arm images show placement of a plate and screws for reduction of a comminuted angulated humeral fracture. Postoperative alignment is anatomic. IMPRESSION: Restoration of anatomic alignment. Electronically Signed   By: Paulina FusiMark  Shogry M.D.   On: 06/08/2017 14:18    Anti-infectives: Anti-infectives (From admission, onward)   Start     Dose/Rate Route Frequency Ordered Stop   06/08/17 1830  ceFAZolin (ANCEF) IVPB 2g/100 mL premix     2 g 200 mL/hr over 30 Minutes Intravenous Every 8 hours 06/08/17 1617 06/09/17 1618   06/08/17 1030  clindamycin (CLEOCIN) IVPB 900 mg     900 mg 100 mL/hr over 30 Minutes Intravenous On call to O.R. 06/08/17 0805 06/08/17 1159   06/06/17 1800  ceFAZolin (ANCEF) IVPB 2g/100 mL premix     2 g 200 mL/hr over 30 Minutes Intravenous Every 8 hours 06/06/17 1358 06/07/17 1440   06/06/17 1157  vancomycin (VANCOCIN) powder  Status:  Discontinued       As needed 06/06/17 1157 06/06/17 1235   06/05/17 1000  ceFAZolin (ANCEF) IVPB 2g/100 mL premix     2 g 200 mL/hr over 30 Minutes Intravenous On call to O.R. 06/05/17 0958 06/06/17 0559   06/05/17 0500  clindamycin (CLEOCIN) IVPB 600 mg  Status:  Discontinued     600 mg 100 mL/hr over 30 Minutes Intravenous Every 6 hours 06/05/17 0352 06/06/17 1358   06/04/17 2115  ceFAZolin (ANCEF) IVPB 2g/100 mL premix     2 g 200 mL/hr over 30 Minutes Intravenous  Once 06/04/17 2107 06/04/17 2144      Assessment/Plan: s/p Procedure(s): OPEN REDUCTION INTERNAL FIXATION  (ORIF) HUMERAL SHAFT FRACTURE Clinically the patient is a good candidate for Rehab.  Rehab screening ordered.  LOS: 6 days   Marta LamasJames O. Gae BonWyatt, III, MD, FACS [phone] Trauma Surgeon 06/10/2017
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
## 2017-06-11 MED ORDER — TRAMADOL HCL 50 MG PO TABS
50.0000 mg | ORAL_TABLET | Freq: Four times a day (QID) | ORAL | Status: DC | PRN
  Administered 2017-06-11 – 2017-06-13 (×4): 50 mg via ORAL
  Filled 2017-06-11 (×4): qty 1

## 2017-06-11 NOTE — Progress Notes (Signed)
Trauma Service Note  Subjective: Patient doing very well today.  Much better pain tolerance  Objective: Vital signs in last 24 hours: Temp:  [98.3 F (36.8 C)-98.8 F (37.1 C)] 98.5 F (36.9 C) (12/29 0506) Pulse Rate:  [89-110] 90 (12/29 0506) Resp:  [18] 18 (12/28 1500) BP: (112-134)/(70-91) 129/72 (12/29 0506) SpO2:  [98 %-100 %] 98 % (12/29 0506) Last BM Date: 06/10/17  Intake/Output from previous day: 12/28 0701 - 12/29 0700 In: 720 [P.O.:720] Out: 750 [Urine:750] Intake/Output this shift: Total I/O In: 240 [P.O.:240] Out: -   General: No distress  Lungs: Clear  Abd: Benign  Extremities: No changes.  Possible sling right upper extremity  Neuro: Intact  Lab Results: CBC  Recent Labs    06/09/17 1001 06/10/17 0700  WBC 12.6* 12.6*  HGB 8.3* 8.7*  HCT 24.7* 26.3*  PLT 209 253   BMET Recent Labs    06/10/17 0700  NA 134*  K 3.8  CL 97*  CO2 25  GLUCOSE 140*  BUN 8  CREATININE 0.79  CALCIUM 8.4*   PT/INR No results for input(s): LABPROT, INR in the last 72 hours. ABG No results for input(s): PHART, HCO3 in the last 72 hours.  Invalid input(s): PCO2, PO2  Studies/Results: No results found.  Anti-infectives: Anti-infectives (From admission, onward)   Start     Dose/Rate Route Frequency Ordered Stop   06/08/17 1830  ceFAZolin (ANCEF) IVPB 2g/100 mL premix     2 g 200 mL/hr over 30 Minutes Intravenous Every 8 hours 06/08/17 1617 06/09/17 1618   06/08/17 1030  clindamycin (CLEOCIN) IVPB 900 mg     900 mg 100 mL/hr over 30 Minutes Intravenous On call to O.R. 06/08/17 0805 06/08/17 1159   06/06/17 1800  ceFAZolin (ANCEF) IVPB 2g/100 mL premix     2 g 200 mL/hr over 30 Minutes Intravenous Every 8 hours 06/06/17 1358 06/07/17 1440   06/06/17 1157  vancomycin (VANCOCIN) powder  Status:  Discontinued       As needed 06/06/17 1157 06/06/17 1235   06/05/17 1000  ceFAZolin (ANCEF) IVPB 2g/100 mL premix     2 g 200 mL/hr over 30 Minutes  Intravenous On call to O.R. 06/05/17 0958 06/06/17 0559   06/05/17 0500  clindamycin (CLEOCIN) IVPB 600 mg  Status:  Discontinued     600 mg 100 mL/hr over 30 Minutes Intravenous Every 6 hours 06/05/17 0352 06/06/17 1358   06/04/17 2115  ceFAZolin (ANCEF) IVPB 2g/100 mL premix     2 g 200 mL/hr over 30 Minutes Intravenous  Once 06/04/17 2107 06/04/17 2144      Assessment/Plan: s/p Procedure(s): OPEN REDUCTION INTERNAL FIXATION (ORIF) HUMERAL SHAFT FRACTURE Girlfriend at the bedside.  Mobilization much better.   Hopefully PT will see him today.  LOS: 7 days   Marta LamasJames O. Gae BonWyatt, III, MD, FACS [phone] Trauma Surgeon 06/11/2017
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
## 2017-06-13 MED ORDER — HYDROMORPHONE HCL 1 MG/ML IJ SOLN
0.5000 mg | Freq: Four times a day (QID) | INTRAMUSCULAR | Status: DC | PRN
  Administered 2017-06-13: 1 mg via INTRAVENOUS
  Filled 2017-06-13: qty 1

## 2017-06-13 MED ORDER — OXYCODONE HCL 5 MG PO TABS
5.0000 mg | ORAL_TABLET | ORAL | Status: DC | PRN
  Administered 2017-06-13 – 2017-06-14 (×6): 10 mg via ORAL
  Filled 2017-06-13 (×6): qty 2

## 2017-06-13 MED ORDER — METHOCARBAMOL 500 MG PO TABS
1000.0000 mg | ORAL_TABLET | Freq: Three times a day (TID) | ORAL | Status: DC
  Administered 2017-06-13 – 2017-06-14 (×4): 1000 mg via ORAL
  Filled 2017-06-13 (×4): qty 2
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
## 2017-06-13 NOTE — Progress Notes (Signed)
Pt no longer in need of an inpt rehab admission. Recommend home with family. We will sign off at this time. [phone]

## 2017-06-14 ENCOUNTER — Inpatient Hospital Stay (HOSPITAL_COMMUNITY): Payer: No Typology Code available for payment source

## 2017-06-14 ENCOUNTER — Encounter (HOSPITAL_COMMUNITY): Payer: Self-pay | Admitting: General Practice

## 2017-06-14 ENCOUNTER — Other Ambulatory Visit: Payer: Self-pay

## 2017-06-14 MED ORDER — OXYCODONE HCL 5 MG PO TABS: 5.0000 mg | ORAL_TABLET | ORAL | 0 refills | Status: AC | PRN

## 2017-06-14 NOTE — Progress Notes (Signed)
Discharges instructions and prescriptions reviewed with patient and mother, verbalized understanding. Patient received all medical equipment to take home. IV removed, catheter tip intact. VSS. No questions/concerns at this time. Transport via wheelchair to lobby for discharge.
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
## 2017-06-14 NOTE — Progress Notes (Signed)
6 Days Post-Op   Subjective/Chief Complaint: Doing well with PT, planning on home   Objective: Vital signs in last 24 hours: Temp:  [98.8 F (37.1 C)] 98.8 F (37.1 C) (01/01 0500) Pulse Rate:  [86-98] 86 (01/01 0500) Resp:  [17-18] 17 (01/01 0500) BP: (127-130)/(76-80) 127/76 (01/01 0500) SpO2:  [100 %] 100 % (01/01 0500) Last BM Date: 06/11/17  Intake/Output from previous day: 12/31 0701 - 01/01 0700 In: 240 [P.O.:240] Out: 1050 [Urine:1050] Intake/Output this shift: No intake/output data recorded.  Constitutional: No acute distress, conversant, appears states age. Eyes: Anicteric sclerae, moist conjunctiva, no lid lag Lungs: Clear to auscultation bilaterally, normal respiratory effort CV: regular rate and rhythm, no murmurs, no peripheral edema, pedal pulses 2+ GI: Soft, no masses or hepatosplenomegaly, non-tender to palpation Skin: No rashes, palpation reveals normal turgor Psychiatric: appropriate judgment and insight, oriented to person, place, and time   Lab Results:  No results for input(s): WBC, HGB, HCT, PLT in the last 72 hours. BMET No results for input(s): NA, K, CL, CO2, GLUCOSE, BUN, CREATININE, CALCIUM in the last 72 hours. PT/INR No results for input(s): LABPROT, INR in the last 72 hours. ABG No results for input(s): PHART, HCO3 in the last 72 hours.  Invalid input(s): PCO2, PO2  Studies/Results: No results found.  Anti-infectives: Anti-infectives (From admission, onward)   Start     Dose/Rate Route Frequency Ordered Stop   06/08/17 1830  ceFAZolin (ANCEF) IVPB 2g/100 mL premix     2 g 200 mL/hr over 30 Minutes Intravenous Every 8 hours 06/08/17 1617 06/09/17 1618   06/08/17 1030  clindamycin (CLEOCIN) IVPB 900 mg     900 mg 100 mL/hr over 30 Minutes Intravenous On call to O.R. 06/08/17 0805 06/08/17 1159   06/06/17 1800  ceFAZolin (ANCEF) IVPB 2g/100 mL premix     2 g 200 mL/hr over 30 Minutes Intravenous Every 8 hours 06/06/17 1358 06/07/17  1440   06/06/17 1157  vancomycin (VANCOCIN) powder  Status:  Discontinued       As needed 06/06/17 1157 06/06/17 1235   06/05/17 1000  ceFAZolin (ANCEF) IVPB 2g/100 mL premix     2 g 200 mL/hr over 30 Minutes Intravenous On call to O.R. 06/05/17 0958 06/06/17 0559   06/05/17 0500  clindamycin (CLEOCIN) IVPB 600 mg  Status:  Discontinued     600 mg 100 mL/hr over 30 Minutes Intravenous Every 6 hours 06/05/17 0352 06/06/17 1358   06/04/17 2115  ceFAZolin (ANCEF) IVPB 2g/100 mL premix     2 g 200 mL/hr over 30 Minutes Intravenous  Once 06/04/17 2107 06/04/17 2144      Assessment/Plan: s/p Procedure(s): OPEN REDUCTION INTERNAL FIXATION (ORIF) HUMERAL SHAFT FRACTURE (Right) will plan on L hand Xray to r/o fracture.  If Xray OK, then OK to DC home.  Home equip ordered. F/u in chart  LOS: 10 days    Marigene EhlersRamirez Jr., [facility] 06/14/2017
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
## 2019-09-20 IMAGING — RF DG C-ARM 61-120 MIN
1 series · 10 of 10 positions shown · non-contrast
Comparison: 06/05/2017

CLINICAL DATA: ORIF lower leg fracture

EXAM:
LEFT TIBIA AND FIBULA - 2 VIEW; DG C-ARM 61-120 MIN

[Series 1: run · 10 of 10 slices shown]
[im 1/10]
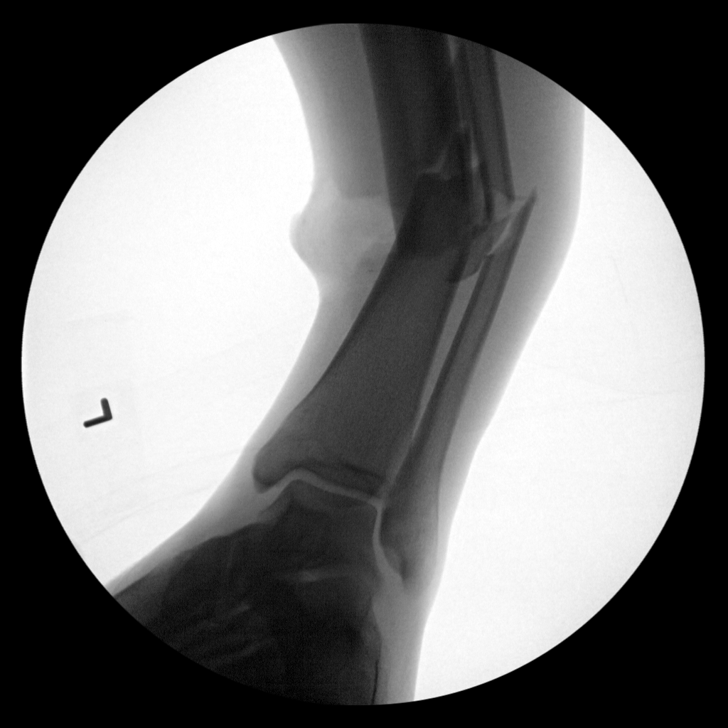
[im 2/10]
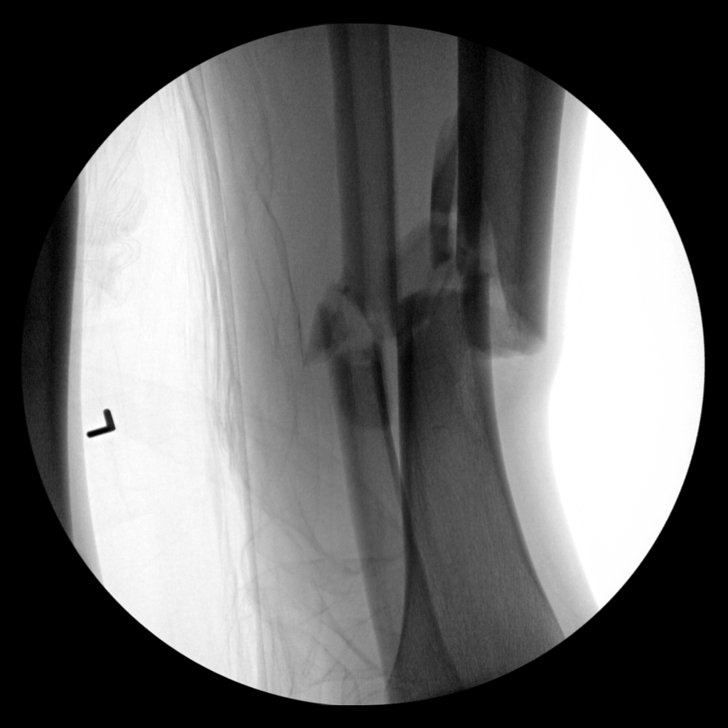
[im 3/10]
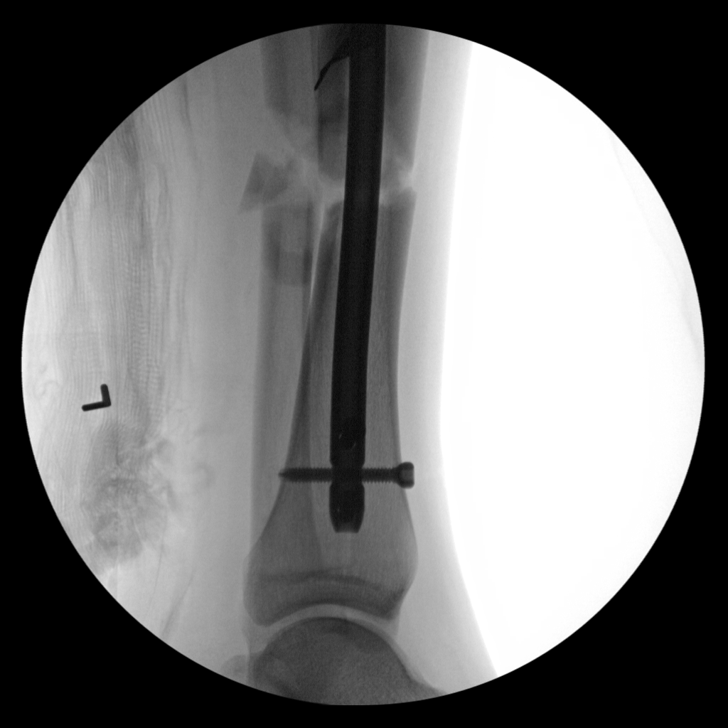
[im 4/10]
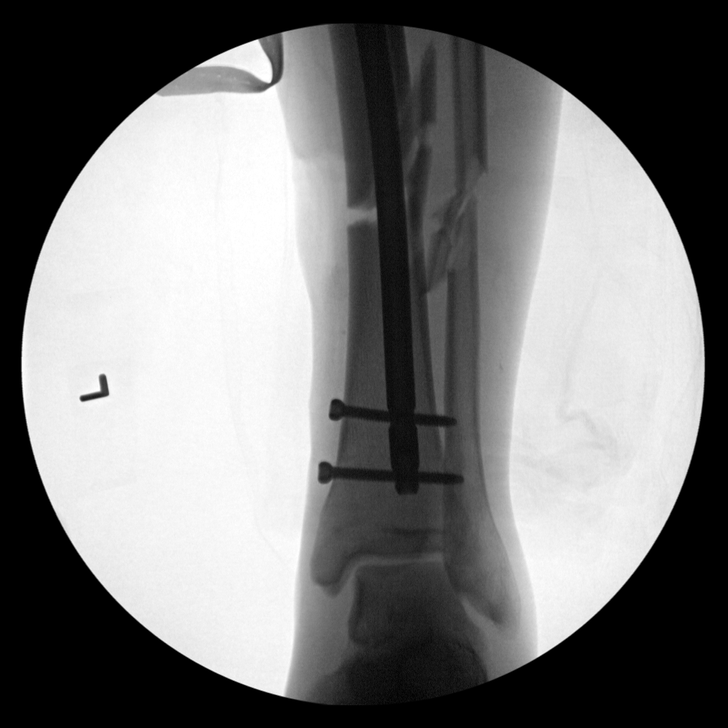
[im 5/10]
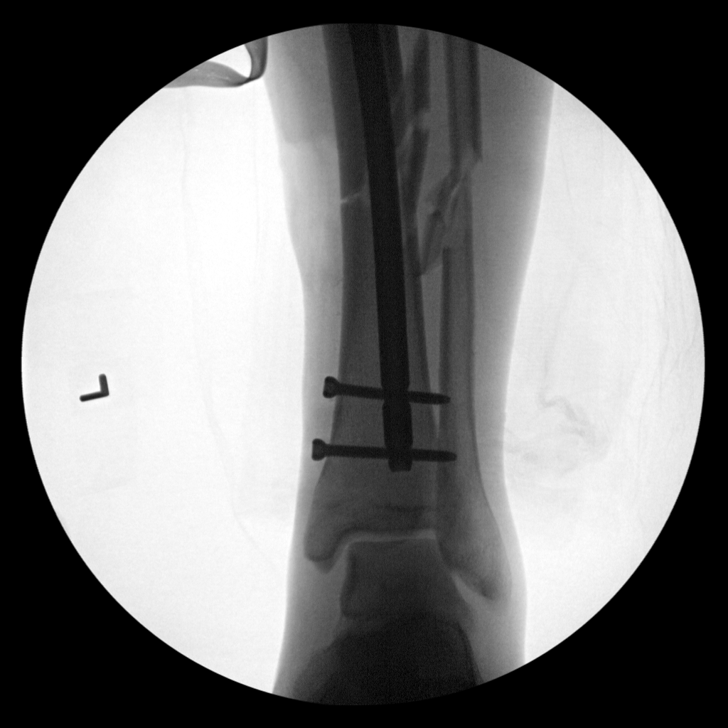
[im 6/10]
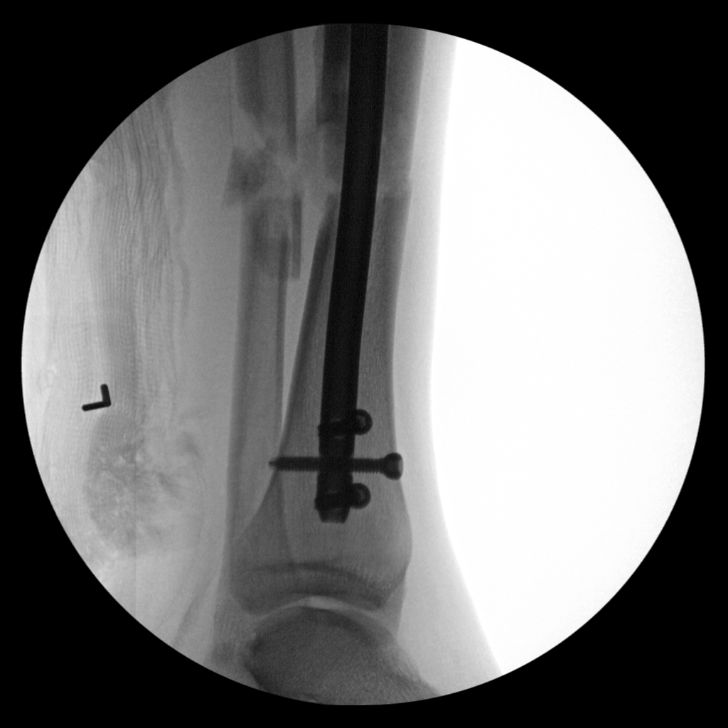
[im 7/10]
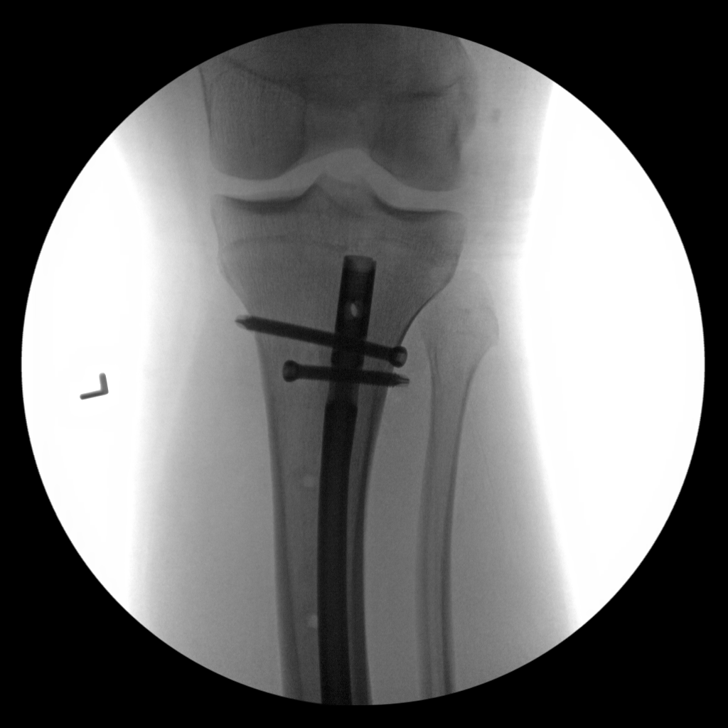
[im 8/10]
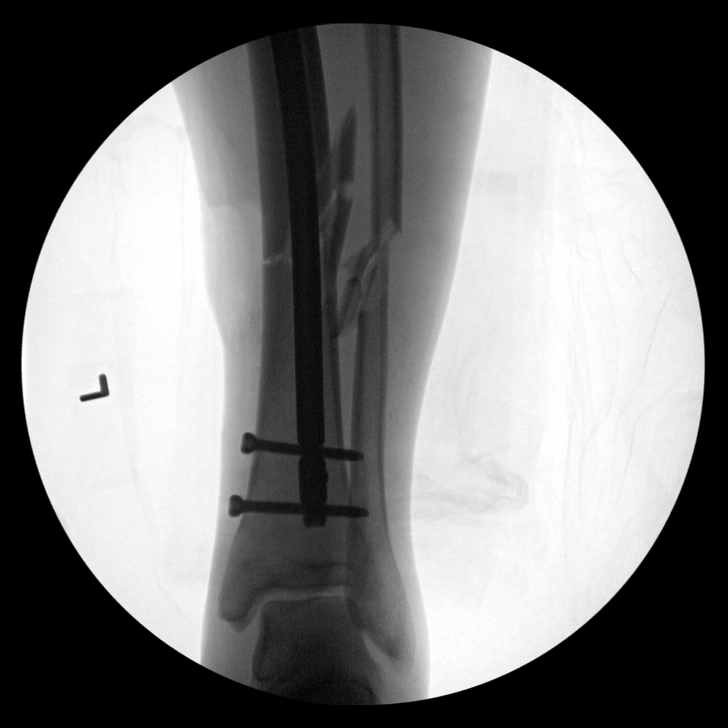
[im 9/10]
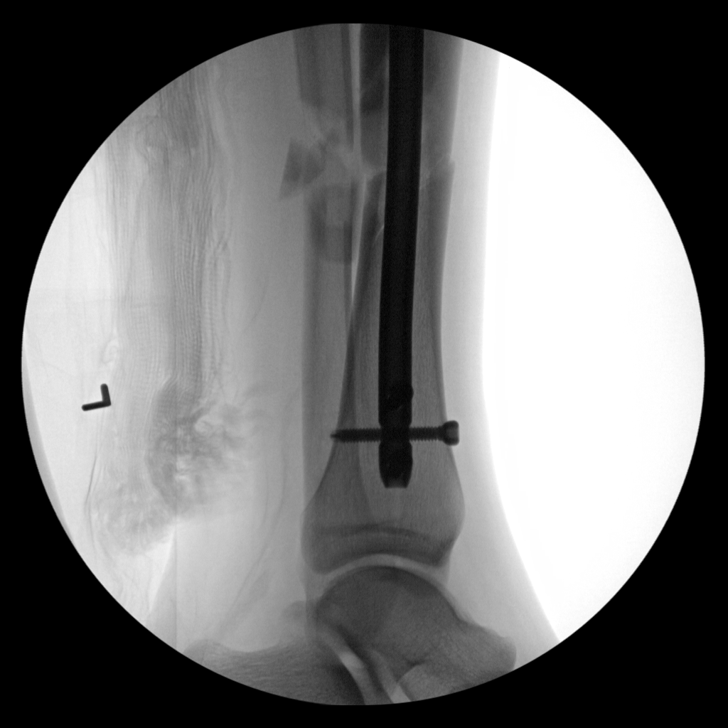
[im 10/10]
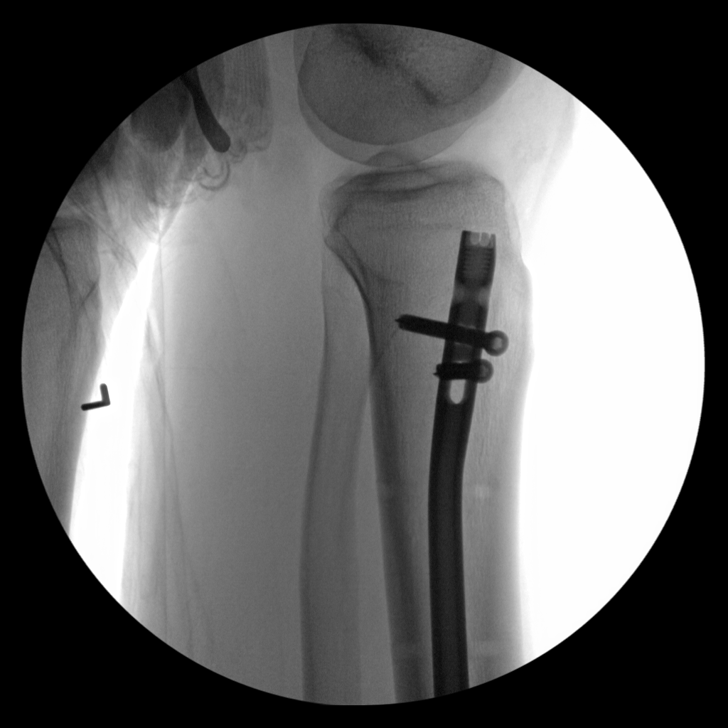

[10 of 10 positions shown; findings below may reference images not displayed]

FINDINGS: Multiple C-arm images show placement of a tibial intramedullary nail
with 2 distal locking screws. Restoration of normal alignment.
Comminuted fracture of the fibular diaphysis shows good alignment,
with a few small fragments.
IMPRESSION: Restoration of near anatomic alignment of the tibia with
intramedullary nail and locking screws. Fibula restored to near
anatomic alignment.
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
## 2022-04-18 ENCOUNTER — Encounter (HOSPITAL_COMMUNITY): Payer: Self-pay | Admitting: Emergency Medicine

## 2022-04-18 ENCOUNTER — Emergency Department (HOSPITAL_COMMUNITY)
Admission: EM | Admit: 2022-04-18 | Discharge: 2022-04-19 | Disposition: A | Discharge status: Home / Self Care | Payer: BC Managed Care – PPO | Attending: Emergency Medicine | Admitting: Emergency Medicine

## 2022-04-18 ENCOUNTER — Other Ambulatory Visit: Payer: Self-pay

## 2022-04-18 ENCOUNTER — Emergency Department (HOSPITAL_COMMUNITY): Payer: BC Managed Care – PPO

## 2022-04-18 DIAGNOSIS — Y9361 Activity, american tackle football: Secondary | ICD-10-CM | POA: Diagnosis not present

## 2022-04-18 DIAGNOSIS — X501XXA Overexertion from prolonged static or awkward postures, initial encounter: Secondary | ICD-10-CM | POA: Insufficient documentation

## 2022-04-18 DIAGNOSIS — S99911A Unspecified injury of right ankle, initial encounter: Secondary | ICD-10-CM | POA: Diagnosis present

## 2022-04-18 DIAGNOSIS — S93401A Sprain of unspecified ligament of right ankle, initial encounter: Secondary | ICD-10-CM | POA: Diagnosis not present

## 2022-04-18 MED ORDER — KETOROLAC TROMETHAMINE 30 MG/ML IJ SOLN
30.0000 mg | Freq: Once | INTRAMUSCULAR | Status: AC
  Administered 2022-04-18: 30 mg via INTRAMUSCULAR
  Filled 2022-04-18: qty 1

## 2022-04-18 NOTE — Progress Notes (Signed)
Orthopedic Tech Progress Note Patient Details:  Corgan Mormile 1990/09/27 [id number]  Ortho Devices Type of Ortho Device: Crutches, Ace wrap Ortho Device/Splint Location: rle ankle ace wrap Ortho Device/Splint Interventions: Ordered, Application, Adjustment   Post Interventions Patient Tolerated: Well Instructions Provided: Care of device, Adjustment of device  Karolee Stamps 04/18/2022, 11:55 PM

## 2022-04-18 NOTE — Discharge Instructions (Signed)
Please read and follow all provided instructions.  Your diagnoses today include:  1. Sprain of right ankle, unspecified ligament, initial encounter     Tests performed today include: An x-ray of your ankle - does NOT show any broken bones Vital signs. See below for your results today.   Medications prescribed:  Please use over-the-counter NSAID medications (ibuprofen, naproxen) or Tylenol (acetaminophen) as directed on the packaging for pain -- as long as you do not have any reasons avoid these medications. Reasons to avoid NSAID medications include: weak kidneys, a history of bleeding in your stomach or gut, or uncontrolled high blood pressure or previous heart attack. Reasons to avoid Tylenol include: liver problems or ongoing alcohol use. Never take more than 4000mg  or 8 Extra strength Tylenol in a 24 hour period.     Take any prescribed medications only as directed.  Home care instructions:  Follow any educational materials contained in this packet Follow R.I.C.E. Protocol: R - rest your injury  I  - use ice on injury without applying directly to skin C - compress injury with bandage or splint E - elevate the injury as much as possible  Follow-up instructions: Please follow-up with your primary care provider or the provided orthopedic (bone specialist) if you continue to have significant pain or trouble walking in 1 week. In this case you may have a severe sprain that requires further care.   Return instructions:  Please return if your toes are numb or tingling, appear gray or blue, or you have severe pain (also elevate leg and loosen splint or wrap) Please return to the Emergency Department if you experience worsening symptoms.  Please return if you have any other emergent concerns.  Additional Information:  Your vital signs today were: BP (!) 154/131 (BP Location: Right Arm)   Pulse 91   Temp 98.5 F (36.9 C)   Resp 16   Ht 6\' 2"  (1.88 m)   Wt 136.1 kg   SpO2 98%   BMI  38.52 kg/m  If your blood pressure (BP) was elevated above 135/85 this visit, please have this repeated by your doctor within one month. -------------- Your caregiver has diagnosed you as suffering from an ankle sprain. Ankle sprain occurs when the ligaments that hold the ankle joint together are stretched or torn. It may take 4 to 6 weeks to heal.  For Activity: If prescribed crutches, use crutches with non-weight bearing for the first few days. Then, you may walk on your ankle as the pain allows, or as instructed. Start gradually with weight bearing on the affected ankle. Once you can walk pain free, then try jogging. When you can run forwards, then you can try moving side-to-side. If you cannot walk without crutches in one week, you need a re-check. --------------

## 2022-04-18 NOTE — ED Provider Triage Note (Signed)
Emergency Medicine Provider Triage Evaluation Note  Jacob Potter , a 31 y.o. male  was evaluated in triage.  Pt complains of acute onset right ankle pain starting approximately 1 and half hours ago.  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  Unable to bear weight due to pain.  No knee pain.  Review of Systems  Positive: Ankle pain Negative: Knee pain  Physical Exam  There were no vitals taken for this visit. Gen:   Awake, no distress   Resp:  Normal effort  MSK:   Moves extremities without difficulty  Other:  Right ankle tenderness bilaterally, currently wrapped, able to flex and extend ankle  Medical Decision Making  Medically screening exam initiated at 7:06 PM.  Appropriate orders placed.  Gyasi Virnig was informed that the remainder of the evaluation will be completed by another provider, this initial triage assessment does not replace that evaluation, and the importance of remaining in the ED until their evaluation is complete.     Carlisle Cater, PA-C 04/18/22 1907

## 2022-04-18 NOTE — ED Triage Notes (Signed)
Pt reports he was playing football w/ some kids and "I rolled my ankle"  Pt is unable to bare weight on his ankle.

## 2022-04-18 NOTE — ED Provider Notes (Signed)
[facility] EMERGENCY DEPARTMENT Provider Note   CSN: [id number] Arrival date & time: 04/18/22  1901     History  Chief Complaint  Patient presents with   Ankle Pain    Jacob Potter is a 31 y.o. male.  Patient seen by myself on arrival in triage.  Pt complains of acute onset right ankle pain starting approximately 1 and half hours ago.  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  Unable to bear weight due to pain.  No knee pain.  No treatments prior to arrival.  No distal numbness or tingling.          Home Medications Prior to Admission medications   Medication Sig Start Date End Date Taking? Authorizing Provider  oxyCODONE (OXY IR/ROXICODONE) 5 MG immediate release tablet Take 1-2 tablets (5-10 mg total) by mouth every 4 (four) hours as needed for moderate pain or severe pain (5 moderate, 10 severe). 06/14/17   Axel Filler, MD      Allergies    Penicillins    Review of Systems   Review of Systems  Physical Exam Updated Vital Signs BP (!) 154/131 (BP Location: Right Arm)   Pulse 91   Temp 98.5 F (36.9 C)   Resp 16   Ht 6\' 2"  (1.88 m)   Wt 136.1 kg   SpO2 98%   BMI 38.52 kg/m   Physical Exam Vitals and nursing note reviewed.  Constitutional:      Appearance: He is well-developed.  HENT:     Head: Normocephalic and atraumatic.  Eyes:     Conjunctiva/sclera: Conjunctivae normal.  Cardiovascular:     Pulses: Normal pulses. No decreased pulses.          Dorsalis pedis pulses are 2+ on the right side.  Musculoskeletal:        General: Tenderness present.     Cervical back: Normal range of motion and neck supple.     Right lower leg: No swelling. No edema.     Left lower leg: No swelling. No edema.     Right ankle: Swelling present. Tenderness present over the lateral malleolus and medial malleolus. Decreased range of motion.  Skin:    General: Skin is warm and dry.  Neurological:     Mental Status: He is alert.      Sensory: No sensory deficit.     Comments: Motor, sensation, and vascular distal to the injury is fully intact.   Psychiatric:        Mood and Affect: Mood normal.     ED Results / Procedures / Treatments   Labs (all labs ordered are listed, but only abnormal results are displayed) Labs Reviewed - No data to display  EKG None  Radiology DG Ankle Complete Right  Result Date: 04/18/2022 CLINICAL DATA:  Trauma, pain EXAM: RIGHT ANKLE - COMPLETE 3+ VIEW COMPARISON:  None Available. FINDINGS: No fracture or dislocation is seen. IMPRESSION: No fracture or dislocation is seen in right ankle. Electronically Signed   By: 13/10/2021 M.D.   On: 04/18/2022 19:56    Procedures Procedures    Medications Ordered in ED Medications  ketorolac (TORADOL) 15 MG/ML injection 30 mg (has no administration in time range)    ED Course/ Medical Decision Making/ A&P    Patient seen and examined. History obtained directly from patient. Work-up including labs, imaging, EKG ordered in triage, if performed, were reviewed.    Labs/EKG: None ordered  Imaging: Independently reviewed and interpreted.  This included: X-ray of the right ankle, agree negative for fracture  Medications/Fluids: Ordered: 30 mg IM Toradol  Most recent vital signs reviewed and are as follows: BP (!) 154/131 (BP Location: Right Arm)   Pulse 91   Temp 98.5 F (36.9 C)   Resp 16   Ht 6\' 2"  (1.88 m)   Wt 136.1 kg   SpO2 98%   BMI 38.52 kg/m   Initial impression: Right ankle sprain  Home treatment plan: Ace wrap, crutches, RICE protocol, OTC NSAIDs and Tylenol  Return instructions discussed with patient: Uncontrolled pain or swelling  Follow-up instructions discussed with patient: Follow-up with orthopedic physician or PCP in the next 1 week for recheck.                          Medical Decision Making Amount and/or Complexity of Data Reviewed Radiology: ordered.  Risk Prescription drug  management.   Patient with ankle injury.  No signs of compartment syndrome.  CMS intact distally without concern for nerve for significant vascular injury.  No knee pain or concern for proximal fibula injury.  The patient's vital signs, pertinent lab work and imaging were reviewed and interpreted as discussed in the ED course. Hospitalization was considered for further testing, treatments, or serial exams/observation. However as patient is well-appearing, has a stable exam, and reassuring studies today, I do not feel that they warrant admission at this time. This plan was discussed with the patient who verbalizes agreement and comfort with this plan and seems reliable and able to return to the Emergency Department with worsening or changing symptoms.          Final Clinical Impression(s) / ED Diagnoses Final diagnoses:  Sprain of right ankle, unspecified ligament, initial encounter    Rx / DC Orders ED Discharge Orders     None         Carlisle Cater, PA-C 04/18/22 [place], MD 04/22/22 [phone]
